# Patient Record
Sex: Male | Born: 1985 | Race: White | Hispanic: No | Marital: Single | Smoking: Current every day smoker
Health system: Southern US, Community
[De-identification: ages and names within clinical notes are randomized; demographics above are authoritative.]

## PROBLEM LIST (undated history)

## (undated) DIAGNOSIS — G4700 Insomnia, unspecified: Secondary | ICD-10-CM

## (undated) DIAGNOSIS — S0990XA Unspecified injury of head, initial encounter: Secondary | ICD-10-CM

## (undated) DIAGNOSIS — G43909 Migraine, unspecified, not intractable, without status migrainosus: Secondary | ICD-10-CM

## (undated) DIAGNOSIS — F1010 Alcohol abuse, uncomplicated: Secondary | ICD-10-CM

## (undated) HISTORY — PX: OTHER SURGICAL HISTORY: SHX169

## (undated) HISTORY — DX: Unspecified injury of head, initial encounter: S09.90XA

## (undated) HISTORY — DX: Migraine, unspecified, not intractable, without status migrainosus: G43.909

---

## 2015-01-12 ENCOUNTER — Emergency Department (HOSPITAL_COMMUNITY)
Admission: EM | Admit: 2015-01-12 | Discharge: 2015-01-12 | Disposition: A | Discharge status: Home / Self Care | Payer: Self-pay | Attending: Emergency Medicine | Admitting: Emergency Medicine

## 2015-01-12 ENCOUNTER — Encounter (HOSPITAL_COMMUNITY): Payer: Self-pay | Admitting: *Deleted

## 2015-01-12 DIAGNOSIS — Z720 Tobacco use: Secondary | ICD-10-CM | POA: Insufficient documentation

## 2015-01-12 DIAGNOSIS — Y939 Activity, unspecified: Secondary | ICD-10-CM | POA: Insufficient documentation

## 2015-01-12 DIAGNOSIS — T40601A Poisoning by unspecified narcotics, accidental (unintentional), initial encounter: Secondary | ICD-10-CM | POA: Insufficient documentation

## 2015-01-12 DIAGNOSIS — Y929 Unspecified place or not applicable: Secondary | ICD-10-CM | POA: Insufficient documentation

## 2015-01-12 DIAGNOSIS — Z79899 Other long term (current) drug therapy: Secondary | ICD-10-CM | POA: Insufficient documentation

## 2015-01-12 DIAGNOSIS — Y999 Unspecified external cause status: Secondary | ICD-10-CM | POA: Insufficient documentation

## 2015-01-12 MED ORDER — ONDANSETRON HCL 4 MG/2ML IJ SOLN
4.0000 mg | Freq: Once | INTRAMUSCULAR | Status: AC
  Administered 2015-01-12: 4 mg via INTRAVENOUS
  Filled 2015-01-12: qty 2

## 2015-01-12 NOTE — ED Notes (Signed)
MD at bedside. 

## 2015-01-12 NOTE — ED Notes (Signed)
Bed: [id number] Expected date:  Expected time:  Means of arrival:  Comments: EMS - OD w/ Narcan

## 2015-01-12 NOTE — ED Notes (Signed)
Pt arrives via ems with heroin overdose,  has also been using cocaine today. Unresponsive, cyanotic, and cold on EMS arrival to seen, pt was given 1mg  intranasal Narcan, without response,  IV established, additional 1mg  Narcan given, pt c/a/o on arrival to the ed. ST on the monitor (120's) enroute.

## 2015-01-12 NOTE — ED Notes (Signed)
Pt had vomited x1, notified dr. Effie Shy, pt c/o nausea, order for zofran. Pt family at bedside. Pt arousal to voice

## 2015-01-12 NOTE — Discharge Instructions (Signed)
Accidental Overdose  A drug overdose occurs when a chemical substance (drug or medication) is used in amounts large enough to overcome a person. This may result in severe illness or death. This is a type of poisoning. Accidental overdoses of medications or other substances come from a variety of reasons. When this happens accidentally, it is often because the person taking the substance does not know enough about what they have taken. Drugs which commonly cause overdose deaths are alcohol, psychotropic medications (medications which affect the mind), pain medications, illegal drugs (street drugs) such as cocaine and heroin, and multiple drugs taken at the same time. It may result from careless behavior (such as over-indulging at a party). Other causes of overdose may include multiple drug use, a lapse in memory, or drug use after a period of no drug use.   Sometimes overdosing occurs because a person cannot remember if they have taken their medication.   A common unintentional overdose in young children involves multi-vitamins containing iron. Iron is a part of the hemoglobin molecule in blood. It is used to transport oxygen to living cells. When taken in small amounts, iron allows the body to restock hemoglobin. In large amounts, it causes problems in the body. If this overdose is not treated, it can lead to death.  Never take medicines that show signs of tampering or do not seem quite right. Never take medicines in the dark or in poor lighting. Read the label and check each dose of medicine before you take it. When adults are poisoned, it happens most often through carelessness or lack of information. Taking medicines in the dark or taking medicine prescribed for someone else to treat the same type of problem is a dangerous practice.  SYMPTOMS   Symptoms of overdose depend on the medication and amount taken. They can vary from over-activity with stimulant over-dosage, to sleepiness from depressants such as  alcohol, narcotics and tranquilizers. Confusion, dizziness, nausea and vomiting may be present. If problems are severe enough coma and death may result.  DIAGNOSIS   Diagnosis and management are generally straightforward if the drug is known. Otherwise it is more difficult. At times, certain symptoms and signs exhibited by the patient, or blood tests, can reveal the drug in question.   TREATMENT   In an emergency department, most patients can be treated with supportive measures. Antidotes may be available if there has been an overdose of opioids or benzodiazepines. A rapid improvement will often occur if this is the cause of overdose.  At home or away from medical care:   There may be no immediate problems or warning signs in children.   Not everything works well in all cases of poisoning.   Take immediate action. Poisons may act quickly.   If you think someone has swallowed medicine or a household product, and the person is unconscious, having seizures (convulsions), or is not breathing, immediately call for an ambulance.  IF a person is conscious and appears to be doing OK but has swallowed a poison:   Do not wait to see what effect the poison will have. Immediately call a poison control center (listed in the white pages of your telephone book under "Poison Control" or inside the front cover with other emergency numbers). Some poison control centers have TTY capability for the deaf. Check with your local center if you or someone in your family requires this service.   Keep the container so you can read the label on the product for ingredients.     Describe what, when, and how much was taken and the age and condition of the person poisoned. Inform them if the person is vomiting, choking, drowsy, shows a change in color or temperature of skin, is conscious or unconscious, or is convulsing.   Do not cause vomiting unless instructed by medical personnel. Do not induce vomiting or force liquids into a person who  is convulsing, unconscious, or very drowsy.  Stay calm and in control.    Activated charcoal also is sometimes used in certain types of poisoning and you may wish to add a supply to your emergency medicines. It is available without a prescription. Call a poison control center before using this medication.  PREVENTION   Thousands of children die every year from unintentional poisoning. This may be from household chemicals, poisoning from carbon monoxide in a car, taking their parent's medications, or simply taking a few iron pills or vitamins with iron. Poisoning comes from unexpected sources.   Store medicines out of the sight and reach of children, preferably in a locked cabinet. Do not keep medications in a food cabinet. Always store your medicines in a secure place. Get rid of expired medications.   If you have children living with you or have them as occasional guests, you should have child-resistant caps on your medicine containers. Keep everything out of reach. Child proof your home.   If you are called to the telephone or to answer the door while you are taking a medicine, take the container with you or put the medicine out of the reach of small children.   Do not take your medication in front of children. Do not tell your child how good a medication is and how good it is for them. They may get the idea it is more of a treat.   If you are an adult and have accidentally taken an overdose, you need to consider how this happened and what can be done to prevent it from happening again. If this was from a street drug or alcohol, determine if there is a problem that needs addressing. If you are not sure a problems exists, it is easy to talk to a professional and ask them if they think you have a problem. It is better to handle this problem in this way before it happens again and has a much worse consequence.  Document Released: 09/17/2004 Document Revised: 09/26/2011 Document Reviewed: 02/23/2009  ExitCare  Patient Information 2015 [employer]. This information is not intended to replace advice given to you by your health care provider. Make sure you discuss any questions you have with your health care provider.

## 2015-01-12 NOTE — ED Provider Notes (Signed)
CSN: [id number]     Arrival date & time 01/12/15  1915 History   First MD Initiated Contact with Patient 01/12/15 1925     Chief Complaint  Patient presents with  . Drug Overdose     (Consider location/radiation/quality/duration/timing/severity/associated sxs/prior Treatment) HPI   Ricky Ford is a 29 y.o. male who presents for evaluation of narcotic overdose. Patient injected heroin, and subsequently had decreased responsiveness. A friend was with him and called EMS. They found the patient with breathing rate, about 4/m, and appearing cyanotic. He initially received Narcan intranasally, without improvement, then IV Narcan with rapid awakening. He did not require additional treatment. When evaluated in the ED, he was remorseful, and related that he had not used heroin previously. States he has been somewhat depressed recently because he does not have a girlfriend. He is working, and recently completed an outpatient treatment for substance abuse. He denies suicidal ideation. No recent illnesses. There are no other no modifying factors.   History reviewed. No pertinent past medical history. History reviewed. No pertinent past surgical history. No family history on file. History  Substance Use Topics  . Smoking status: Current Every Day Smoker  . Smokeless tobacco: Not on file  . Alcohol Use: Yes    Review of Systems  All other systems reviewed and are negative.     Allergies  Review of patient's allergies indicates no known allergies.  Home Medications   Prior to Admission medications   Medication Sig Start Date End Date Taking? Authorizing Provider  acetaminophen (TYLENOL) 500 MG tablet Take 1,000 mg by mouth every 6 (six) hours as needed (for pain.).   Yes Historical Provider, MD  citalopram (CELEXA) 20 MG tablet Take 20 mg by mouth daily.   Yes Historical Provider, MD  loratadine (CLARITIN) 10 MG tablet Take 10 mg by mouth daily.   Yes Historical Provider, MD  traZODone  (DESYREL) 50 MG tablet Take 50 mg by mouth at bedtime as needed for sleep.   Yes Historical Provider, MD   BP 122/71 mmHg  Pulse 107  Temp(Src) 98.3 F (36.8 C) (Oral)  Resp 19  Ht 5\' 1"  (1.549 m)  Wt 165 lb (74.844 kg)  BMI 31.19 kg/m2  SpO2 94% Physical Exam  Constitutional: He is oriented to person, place, and time. He appears well-developed and well-nourished.  HENT:  Head: Normocephalic and atraumatic.  Right Ear: External ear normal.  Left Ear: External ear normal.  Eyes: Conjunctivae and EOM are normal. Pupils are equal, round, and reactive to light.  Neck: Normal range of motion and phonation normal. Neck supple.  Cardiovascular: Normal rate, regular rhythm and normal heart sounds.   Pulmonary/Chest: Effort normal and breath sounds normal. He exhibits no bony tenderness.  Abdominal: Soft. There is no tenderness.  Musculoskeletal: Normal range of motion.  Neurological: He is alert and oriented to person, place, and time. No cranial nerve deficit or sensory deficit. He exhibits normal muscle tone. Coordination normal.  Skin: Skin is warm, dry and intact.  Psychiatric: He has a normal mood and affect. His behavior is normal. Judgment and thought content normal.  Nursing note and vitals reviewed.   ED Course  Procedures (including critical care time)  Medications  ondansetron (ZOFRAN) injection 4 mg (4 mg Intravenous Given 01/12/15 2146)    Patient Vitals for the past 24 hrs:  BP Temp Temp src Pulse Resp SpO2 Height Weight  01/12/15 2301 122/71 mmHg - - 107 19 94 % - -  01/12/15 2256 - - -  97 13 94 % - -  01/12/15 2246 - - - 94 13 94 % - -  01/12/15 2232 - - - 115 19 94 % - -  01/12/15 2230 123/70 mmHg - - 108 13 92 % - -  01/12/15 2200 115/70 mmHg - - 102 13 91 % - -  01/12/15 2147 - - - 98 10 91 % - -  01/12/15 2130 119/76 mmHg - - 105 13 (!) 89 % - -  01/12/15 2100 122/76 mmHg - - 102 14 96 % - -  01/12/15 2030 121/77 mmHg - - 111 14 98 % - -  01/12/15 2000  126/77 mmHg - - 117 16 96 % - -  01/12/15 1925 124/86 mmHg 98.3 F (36.8 C) Oral 115 15 90 %  (1.549 m) 165 lb (74.844 kg)    11:07 PM Reevaluation with update and discussion. After initial assessment and treatment, an updated evaluation reveals he is comfortable, alert, lucid. Somewhat low oxygen saturation at 92% that improves when his lungs are reexamined. The lungs continue to be clear. Findings discussed with patient and mother and father who are now here. All questions answered.Mancel Bale L    Labs Review Labs Reviewed - No data to display  Imaging Review No results found.   EKG Interpretation None      MDM   Final diagnoses:  Narcotic overdose, accidental or unintentional, initial encounter    Accidental overdose, with heroin. He is been observed for 4 hours, since the Narcan administration, and does not have decreased responsiveness. He is stable for discharge at this time.   Nursing Notes Reviewed/ Care Coordinated Applicable Imaging Reviewed Interpretation of Laboratory Data incorporated into ED treatment  The patient appears reasonably screened and/or stabilized for discharge and I doubt any other medical condition or other [facility] requiring further screening, evaluation, or treatment in the ED at this time prior to discharge.  Plan: Home Medications- none; Home Treatments- rest; return here if the recommended treatment, does not improve the symptoms; Recommended follow up- PCP of choice prn. I encouraged him to f/u at [facility] for further treatment. His father is going home with him tonight.    Mancel Bale, MD 01/12/15 [phone]

## 2015-12-11 ENCOUNTER — Emergency Department (HOSPITAL_COMMUNITY): Payer: BLUE CROSS/BLUE SHIELD

## 2015-12-11 ENCOUNTER — Emergency Department (HOSPITAL_COMMUNITY)
Admission: EM | Admit: 2015-12-11 | Discharge: 2015-12-11 | Disposition: A | Discharge status: Home Health | Payer: BLUE CROSS/BLUE SHIELD | Attending: Emergency Medicine | Admitting: Emergency Medicine

## 2015-12-11 ENCOUNTER — Encounter (HOSPITAL_COMMUNITY): Payer: Self-pay | Admitting: Emergency Medicine

## 2015-12-11 DIAGNOSIS — Z791 Long term (current) use of non-steroidal anti-inflammatories (NSAID): Secondary | ICD-10-CM | POA: Insufficient documentation

## 2015-12-11 DIAGNOSIS — F17200 Nicotine dependence, unspecified, uncomplicated: Secondary | ICD-10-CM | POA: Diagnosis not present

## 2015-12-11 DIAGNOSIS — R1012 Left upper quadrant pain: Secondary | ICD-10-CM | POA: Insufficient documentation

## 2015-12-11 DIAGNOSIS — Z79899 Other long term (current) drug therapy: Secondary | ICD-10-CM | POA: Diagnosis not present

## 2015-12-11 HISTORY — DX: Insomnia, unspecified: G47.00

## 2015-12-11 LAB — CBC
HCT: 41.5 % (ref 39.0–52.0)
Hemoglobin: 14.1 g/dL (ref 13.0–17.0)
MCH: 31.9 pg (ref 26.0–34.0)
MCHC: 34 g/dL (ref 30.0–36.0)
MCV: 93.9 fL (ref 78.0–100.0)
Platelets: 209 10*3/uL (ref 150–400)
RBC: 4.42 MIL/uL (ref 4.22–5.81)
RDW: 12.6 % (ref 11.5–15.5)
WBC: 7.9 10*3/uL (ref 4.0–10.5)

## 2015-12-11 LAB — COMPREHENSIVE METABOLIC PANEL
ALBUMIN: 4.6 g/dL (ref 3.5–5.0)
ALT: 30 U/L (ref 17–63)
AST: 28 U/L (ref 15–41)
Alkaline Phosphatase: 73 U/L (ref 38–126)
Anion gap: 11 (ref 5–15)
BUN: 6 mg/dL (ref 6–20)
CALCIUM: 8.8 mg/dL — AB (ref 8.9–10.3)
CHLORIDE: 100 mmol/L — AB (ref 101–111)
CO2: 21 mmol/L — ABNORMAL LOW (ref 22–32)
Creatinine, Ser: 0.75 mg/dL (ref 0.61–1.24)
GFR calc Af Amer: >60 mL/min (ref >60)
GFR calc non Af Amer: >60 mL/min (ref >60)
GLUCOSE: 135 mg/dL — AB (ref 65–99)
Potassium: 3.2 mmol/L — ABNORMAL LOW (ref 3.5–5.1)
SODIUM: 132 mmol/L — AB (ref 135–145)
Total Bilirubin: 1.1 mg/dL (ref 0.3–1.2)
Total Protein: 7.8 g/dL (ref 6.5–8.1)

## 2015-12-11 LAB — URINALYSIS, ROUTINE W REFLEX MICROSCOPIC
Bilirubin Urine: NEGATIVE
GLUCOSE, UA: NEGATIVE mg/dL
Hgb urine dipstick: NEGATIVE
Ketones, ur: 15 mg/dL — AB
LEUKOCYTES UA: NEGATIVE
NITRITE: NEGATIVE
PH: 6 (ref 5.0–8.0)
Protein, ur: NEGATIVE mg/dL
SPECIFIC GRAVITY, URINE: 1.004 — AB (ref 1.005–1.030)

## 2015-12-11 LAB — ETHANOL

## 2015-12-11 LAB — LIPASE, BLOOD: Lipase: 19 U/L (ref 11–51)

## 2015-12-11 LAB — I-STAT TROPONIN, ED: Troponin i, poc: 0 ng/mL (ref 0.00–0.08)

## 2015-12-11 MED ORDER — HYDROMORPHONE HCL 1 MG/ML IJ SOLN
1.0000 mg | Freq: Once | INTRAMUSCULAR | Status: AC
  Administered 2015-12-11: 1 mg via INTRAVENOUS
  Filled 2015-12-11: qty 1

## 2015-12-11 MED ORDER — SODIUM CHLORIDE 0.9 % IV SOLN
INTRAVENOUS | Status: DC
  Administered 2015-12-11: 19:00:00 via INTRAVENOUS

## 2015-12-11 MED ORDER — ONDANSETRON HCL 4 MG/2ML IJ SOLN
4.0000 mg | Freq: Once | INTRAMUSCULAR | Status: AC
  Administered 2015-12-11: 4 mg via INTRAVENOUS
  Filled 2015-12-11: qty 2

## 2015-12-11 MED ORDER — NAPROXEN 500 MG PO TABS: 500.0000 mg | ORAL_TABLET | Freq: Two times a day (BID) | ORAL | Status: DC

## 2015-12-11 MED ORDER — SODIUM CHLORIDE 0.9 % IV BOLUS (SEPSIS)
1000.0000 mL | Freq: Once | INTRAVENOUS | Status: AC
  Administered 2015-12-11: 1000 mL via INTRAVENOUS

## 2015-12-11 NOTE — ED Notes (Signed)
Pt given discharge instructions. Denies further questions or concerns. Pt states pain and nausea at a controlled level. Pt ambulatory to exit without difficulty.

## 2015-12-11 NOTE — ED Notes (Signed)
Pt requested IV to be removed and to have ice water. Spoke with Dr. Lynelle DoctorKnapp, pt to be discharged. IV removed intact. Site clean and dry. Pt given ice water per request. Pt allowed to change clothes at this time. Awaiting discharge papers.

## 2015-12-11 NOTE — ED Notes (Signed)
Pt aware urine sample is needed, cup provieded

## 2015-12-11 NOTE — Discharge Instructions (Signed)
Flank Pain °Flank pain refers to pain that is located on the side of the body between the upper abdomen and the back. The pain may occur over a short period of time (acute) or may be long-term or reoccurring (chronic). It may be mild or severe. Flank pain can be caused by many things. °CAUSES  °Some of the more common causes of flank pain include: °· Muscle strains.   °· Muscle spasms.   °· A disease of your spine (vertebral disk disease).   °· A lung infection (pneumonia).   °· Fluid around your lungs (pulmonary edema).   °· A kidney infection.   °· Kidney stones.   °· A very painful skin rash caused by the chickenpox virus (shingles).   °· Gallbladder disease.   °HOME CARE INSTRUCTIONS  °Home care will depend on the cause of your pain. In general, °· Rest as directed by your caregiver. °· Drink enough fluids to keep your urine clear or pale yellow. °· Only take over-the-counter or prescription medicines as directed by your caregiver. Some medicines may help relieve the pain. °· Tell your caregiver about any changes in your pain. °· Follow up with your caregiver as directed. °SEEK IMMEDIATE MEDICAL CARE IF:  °· Your pain is not controlled with medicine.   °· You have new or worsening symptoms. °· Your pain increases.   °· You have abdominal pain.   °· You have shortness of breath.   °· You have persistent nausea or vomiting.   °· You have swelling in your abdomen.   °· You feel faint or pass out.   °· You have blood in your urine. °· You have a fever or persistent symptoms for more than 2-3 days. °· You have a fever and your symptoms suddenly get worse. °MAKE SURE YOU:  °· Understand these instructions. °· Will watch your condition. °· Will get help right away if you are not doing well or get worse. °  °This information is not intended to replace advice given to you by your health care provider. Make sure you discuss any questions you have with your health care provider. °  °Document Released: 08/25/2005 Document  Revised: 03/28/2012 Document Reviewed: 02/16/2012 °Elsevier Interactive Patient Education ©2016 [employer] ° °

## 2015-12-11 NOTE — ED Notes (Signed)
Pt c/o dizziness, abdominal pain LUQ abdominal mass x several weeks. Pt states he has been drinking heavily, 6-8 beers last night.

## 2015-12-11 NOTE — ED Notes (Signed)
Pt reports using cocaine at 16:30 on 12/10/15.

## 2015-12-11 NOTE — ED Notes (Signed)
Knapp MD at bedside  

## 2015-12-11 NOTE — ED Notes (Signed)
Pt observed ambulating to room from triage without difficulty.

## 2015-12-11 NOTE — ED Provider Notes (Signed)
CSN: [id number]     Arrival date & time 12/11/15  1613 History   First MD Initiated Contact with Patient 12/11/15 1710     Chief Complaint  Patient presents with  . Abdominal Pain  . Near Syncope    HPI Pt has been having pain in his left flank and upper abdomen.  It started a few weeks ago.  It got more severe last night.  He has been using alcohol regularly and he was using cocaine yesterday.  No vomiting.   NO diarrhea.  No dysuria.  Palpation and movement makes it worse. Past Medical History  Diagnosis Date  . Insomnia    No past surgical history on file. No family history on file. Social History  Substance Use Topics  . Smoking status: Current Every Day Smoker  . Smokeless tobacco: Not on file  . Alcohol Use: Yes    Review of Systems  All other systems reviewed and are negative.     Allergies  Review of patient's allergies indicates no known allergies.  Home Medications   Prior to Admission medications   Medication Sig Start Date End Date Taking? Authorizing Provider  acetaminophen (TYLENOL) 500 MG tablet Take 1,000 mg by mouth every 6 (six) hours as needed (for pain.).   Yes Historical Provider, MD  citalopram (CELEXA) 20 MG tablet Take 20 mg by mouth daily.   Yes Historical Provider, MD  fluticasone (FLONASE) 50 MCG/ACT nasal spray Place 1 spray into both nostrils daily as needed for allergies or rhinitis.   Yes Historical Provider, MD  loratadine (CLARITIN) 10 MG tablet Take 10 mg by mouth daily as needed for allergies.    Yes Historical Provider, MD  naproxen (NAPROSYN) 500 MG tablet Take 1 tablet (500 mg total) by mouth 2 (two) times daily. 12/11/15   Linwood Dibbles, MD   BP 134/93 mmHg  Pulse 79  Temp(Src) 98.7 F (37.1 C) (Oral)  Resp 19  SpO2 97% Physical Exam  Constitutional: He appears well-developed and well-nourished. No distress.  HENT:  Head: Normocephalic and atraumatic.  Right Ear: External ear normal.  Left Ear: External ear normal.  Eyes:  Conjunctivae are normal. Right eye exhibits no discharge. Left eye exhibits no discharge. No scleral icterus.  Neck: Neck supple. No tracheal deviation present.  Cardiovascular: Regular rhythm and intact distal pulses.  Tachycardia present.   Pulmonary/Chest: Effort normal and breath sounds normal. No stridor. No respiratory distress. He has no wheezes. He has no rales.  Abdominal: Soft. Bowel sounds are normal. He exhibits no distension. There is no tenderness. There is no rebound and no guarding.  Musculoskeletal: He exhibits no edema or tenderness.  Neurological: He is alert. He has normal strength. No cranial nerve deficit (no facial droop, extraocular movements intact, no slurred speech) or sensory deficit. He exhibits normal muscle tone. He displays no seizure activity. Coordination normal.  Skin: Skin is warm and dry. No rash noted.  Psychiatric: He has a normal mood and affect.  Nursing note and vitals reviewed.   ED Course  Procedures (including critical care time) Labs Review Labs Reviewed  COMPREHENSIVE METABOLIC PANEL - Abnormal; Notable for the following:    Sodium 132 (*)    Potassium 3.2 (*)    Chloride 100 (*)    CO2 21 (*)    Glucose, Bld 135 (*)    Calcium 8.8 (*)    All other components within normal limits  URINALYSIS, ROUTINE W REFLEX MICROSCOPIC (NOT AT [facility]) - Abnormal; Notable  for the following:    Specific Gravity, Urine 1.004 (*)    Ketones, ur 15 (*)    All other components within normal limits  LIPASE, BLOOD  CBC  ETHANOL  I-STAT TROPOININ, ED    Imaging Review Ct Abdomen Pelvis Wo Contrast  12/11/2015  CLINICAL DATA:  Dizziness, abdominal pain, left upper quadrant pain EXAM: CT ABDOMEN AND PELVIS WITHOUT CONTRAST TECHNIQUE: Multidetector CT imaging of the abdomen and pelvis was performed following the standard protocol without IV contrast. COMPARISON:  None. FINDINGS: Lower chest:  Clear lung bases.  Normal heart size. Hepatobiliary: Normal liver.   Normal gallbladder. Pancreas: Normal pancreas. Spleen: Normal spleen. Adrenals/Urinary Tract: Normal adrenal glands. Normal kidneys. No urolithiasis or obstructive uropathy. Normal bladder. Stomach/Bowel: No bowel wall thickening or dilatation. No pneumatosis, pneumoperitoneum or portal venous gas. No abdominal or pelvic free fluid. Vascular/Lymphatic: Normal caliber abdominal aorta. No lymphadenopathy. Other: No fluid collection or hematoma. Musculoskeletal: No lytic or sclerotic osseous lesion. No acute osseous abnormality. IMPRESSION: 1. No acute abdominal or pelvic pathology. Electronically Signed   By: Elige KoHetal  Patel   On: 12/11/2015 18:09   I have personally reviewed and evaluated these images and lab results as part of my medical decision-making.   MDM   Final diagnoses:  Left upper quadrant pain    Lab tests are normal.  CT scan without acute findings.  ? Muscular etiology.  DC home with nsaids.   Follow up with PCP    Linwood DibblesJon Zen Felling, MD 12/11/15 2030

## 2015-12-11 NOTE — ED Notes (Signed)
Patient still unable to provide urine sample. Patient will notify staff when able to provide sample.

## 2015-12-11 NOTE — ED Notes (Signed)
Received report on pt. Bedside introduction compelted with pt and pt's father at bedside. Pt states pain decreased to 5/10 and is tolerable at this time. Pt denies nausea. Pt remains with elevated BP. NS infusion continues. Pt able to void. Urine sample sent. Monitoring continues.

## 2017-09-01 IMAGING — CT CT ABD-PELV W/O CM
2 of 4 series · 16 of 46 positions shown, 18 images · non-contrast
Comparison: None.

CLINICAL DATA: Dizziness, abdominal pain, left upper quadrant pain

EXAM:
CT ABDOMEN AND PELVIS WITHOUT CONTRAST
TECHNIQUE: Multidetector CT imaging of the abdomen and pelvis was performed
following the standard protocol without IV contrast.

[Series 2: abd/pel w/o · axial · non-contrast · 0.72mm/px · z∈[-306,+99]mm · 13 of 91 slices shown, 15 images]
[im 5/91  soft-tissue]
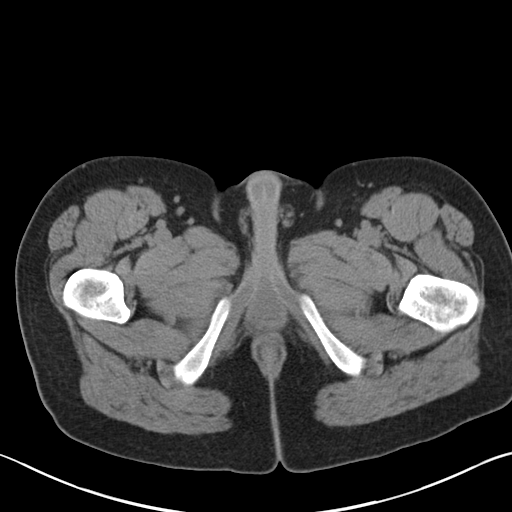
[im 5/91  bone]
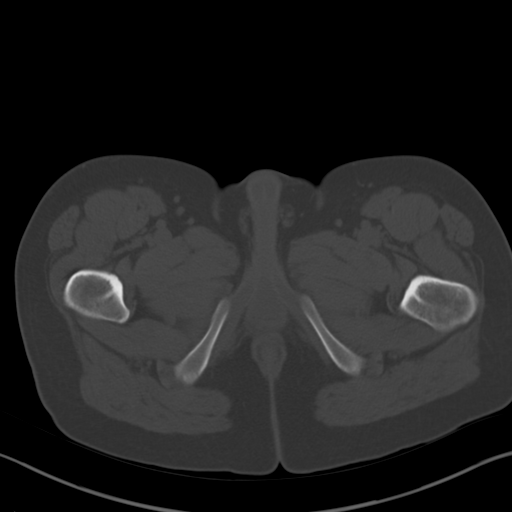
[im 15/91  soft-tissue]
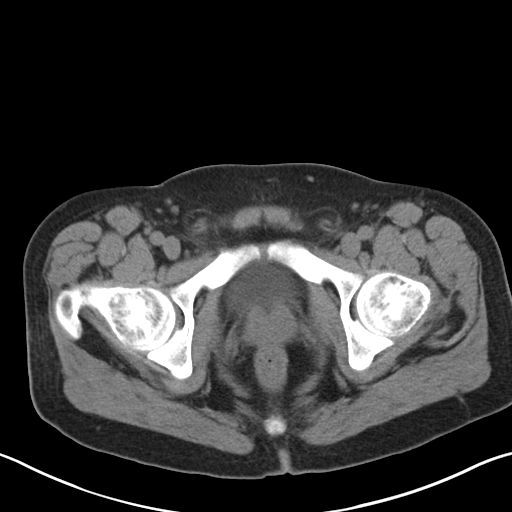
[im 19/91  soft-tissue]
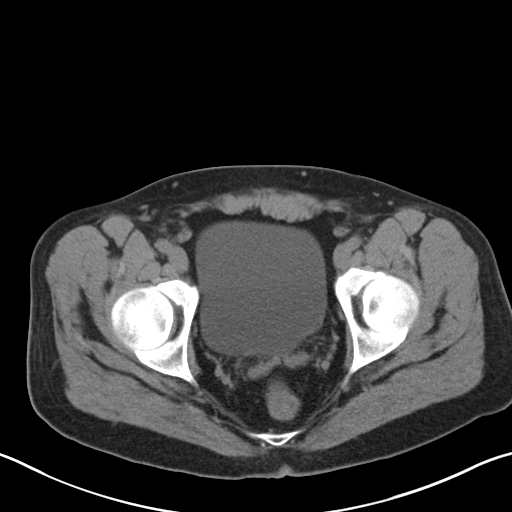
[im 24/91  soft-tissue]
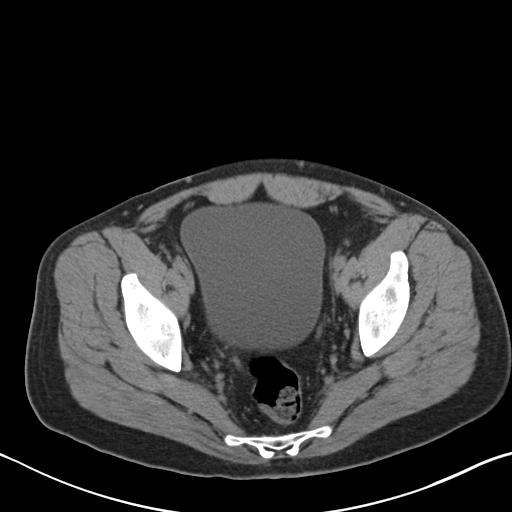
[im 34/91  soft-tissue]
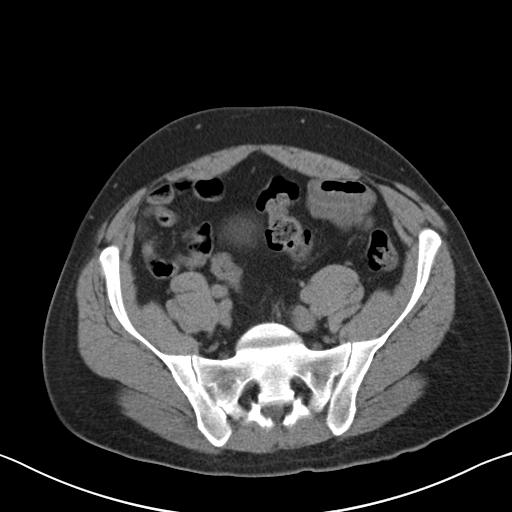
[im 38/91  soft-tissue]
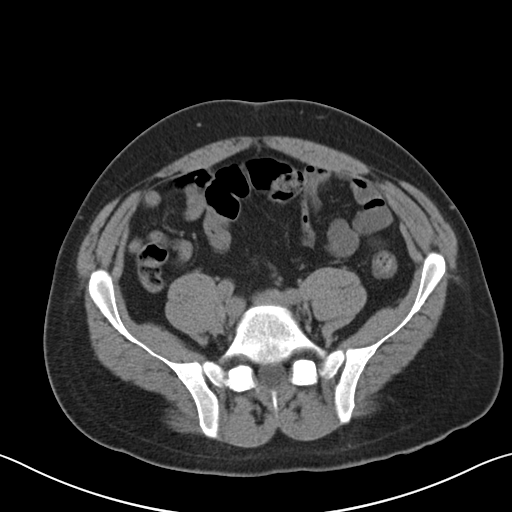
[im 48/91  soft-tissue]
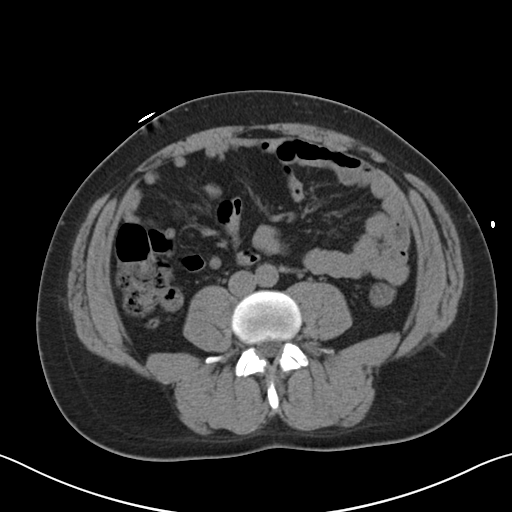
[im 53/91  soft-tissue]
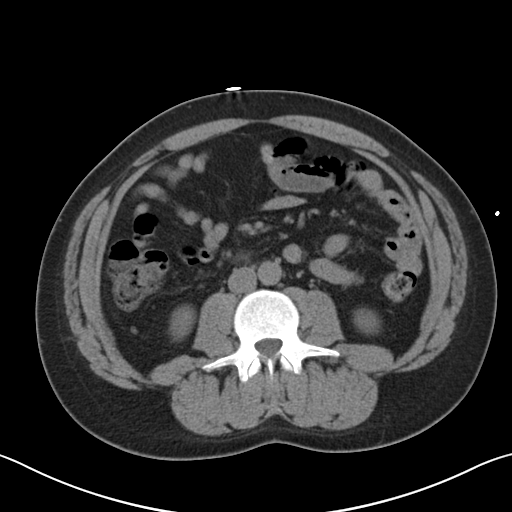
[im 57/91  soft-tissue]
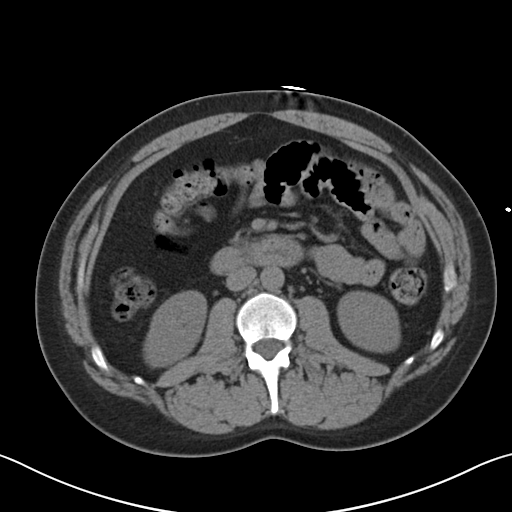
[im 57/91  bone]
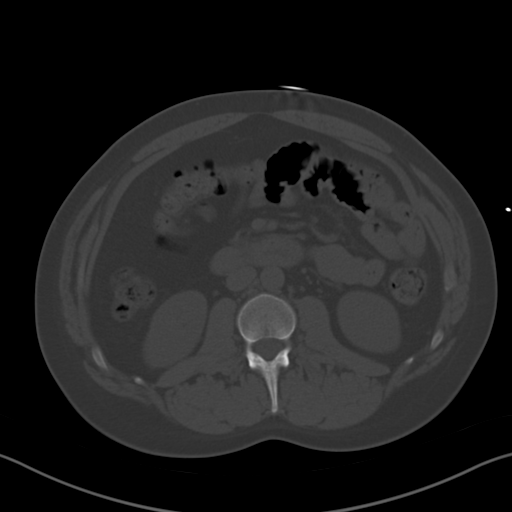
[im 67/91  soft-tissue]
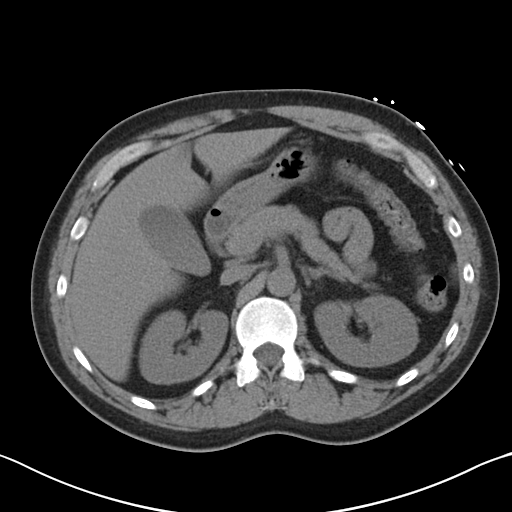
[im 72/91  soft-tissue]
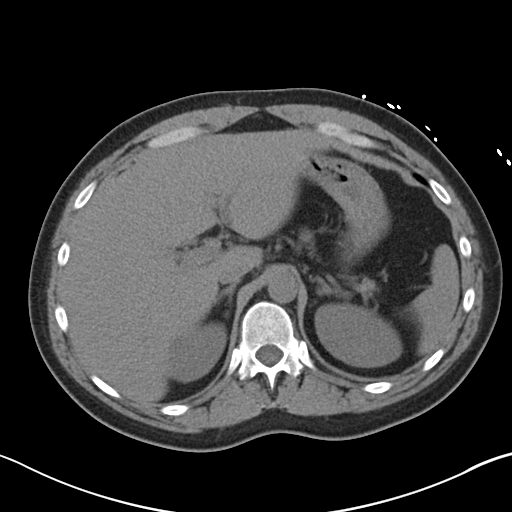
[im 76/91  soft-tissue]
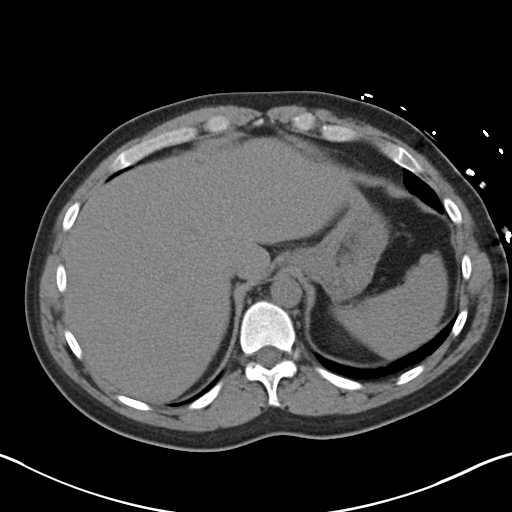
[im 86/91  soft-tissue]
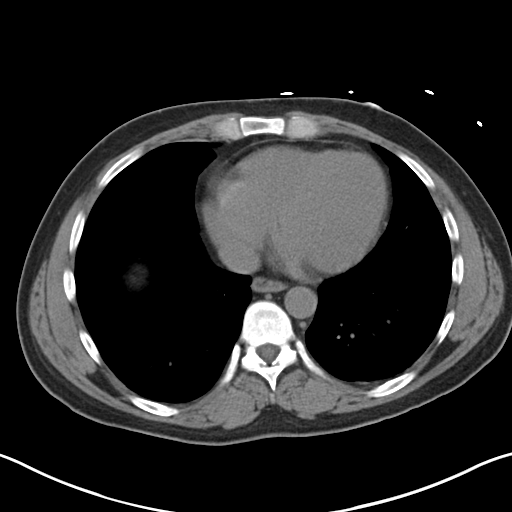

[Series 3: coronal · coronal · 0.68mm/px · 3 of 139 slices shown]
[im 47/139  soft-tissue]
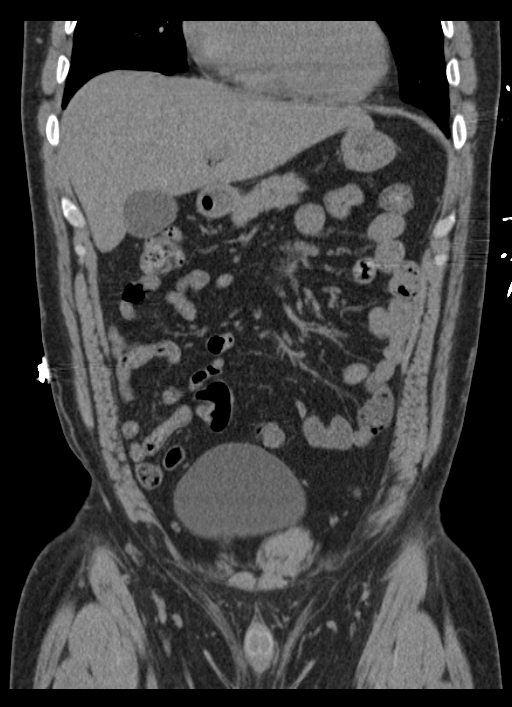
[im 62/139  soft-tissue]
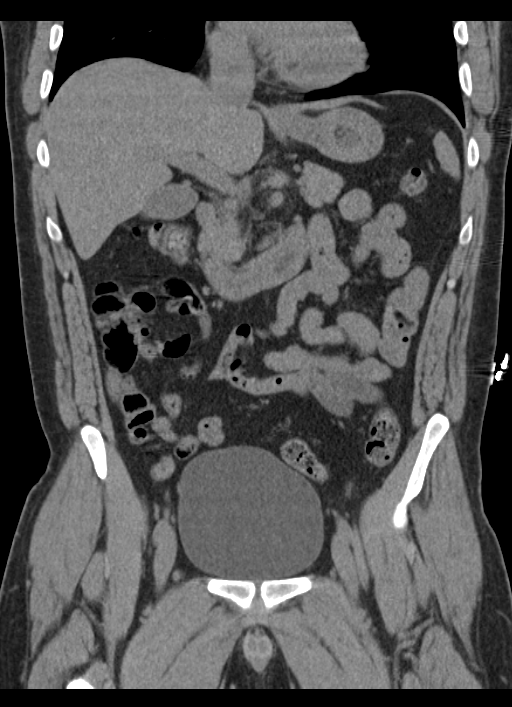
[im 77/139  soft-tissue]
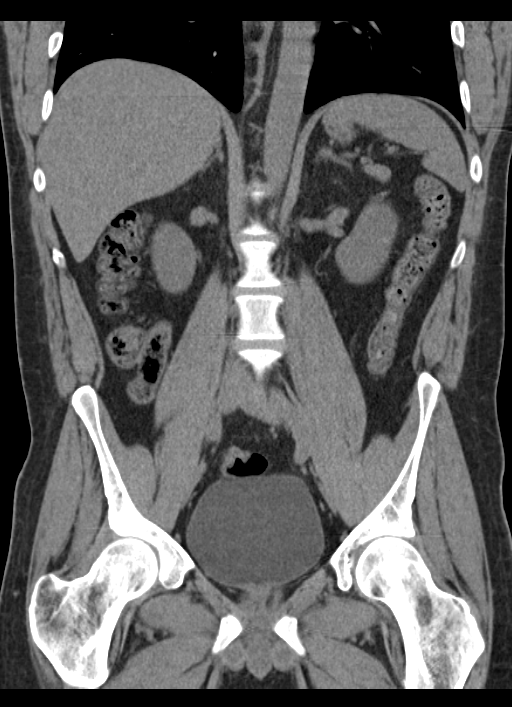

[16 of 46 positions shown; findings below may reference images not displayed]

FINDINGS: Lower chest:  Clear lung bases.  Normal heart size.

Hepatobiliary: Normal liver.  Normal gallbladder.

Pancreas: Normal pancreas.

Spleen: Normal spleen.

Adrenals/Urinary Tract: Normal adrenal glands. Normal kidneys. No
urolithiasis or obstructive uropathy. Normal bladder.

Stomach/Bowel: No bowel wall thickening or dilatation. No
pneumatosis, pneumoperitoneum or portal venous gas. No abdominal or
pelvic free fluid.

Vascular/Lymphatic: Normal caliber abdominal aorta. No
lymphadenopathy.

Other: No fluid collection or hematoma.

Musculoskeletal: No lytic or sclerotic osseous lesion. No acute
osseous abnormality.
IMPRESSION: 1. No acute abdominal or pelvic pathology.

## 2019-01-16 ENCOUNTER — Other Ambulatory Visit: Payer: Self-pay

## 2019-01-16 ENCOUNTER — Encounter (HOSPITAL_COMMUNITY): Payer: Self-pay

## 2019-01-16 ENCOUNTER — Emergency Department (HOSPITAL_COMMUNITY)
Admission: EM | Admit: 2019-01-16 | Discharge: 2019-01-16 | Disposition: A | Discharge status: Home / Self Care | Payer: BC Managed Care – PPO | Attending: Emergency Medicine | Admitting: Emergency Medicine

## 2019-01-16 DIAGNOSIS — F22 Delusional disorders: Secondary | ICD-10-CM | POA: Diagnosis not present

## 2019-01-16 DIAGNOSIS — F1910 Other psychoactive substance abuse, uncomplicated: Secondary | ICD-10-CM | POA: Diagnosis not present

## 2019-01-16 DIAGNOSIS — Z79899 Other long term (current) drug therapy: Secondary | ICD-10-CM | POA: Insufficient documentation

## 2019-01-16 DIAGNOSIS — F419 Anxiety disorder, unspecified: Secondary | ICD-10-CM | POA: Insufficient documentation

## 2019-01-16 DIAGNOSIS — F17210 Nicotine dependence, cigarettes, uncomplicated: Secondary | ICD-10-CM | POA: Diagnosis not present

## 2019-01-16 DIAGNOSIS — R443 Hallucinations, unspecified: Secondary | ICD-10-CM

## 2019-01-16 MED ORDER — HYDROXYZINE HCL 25 MG PO TABS
50.0000 mg | ORAL_TABLET | Freq: Once | ORAL | Status: AC
  Administered 2019-01-16: 10:00:00 50 mg via ORAL
  Filled 2019-01-16: qty 2

## 2019-01-16 NOTE — ED Notes (Signed)
Bed: QP61 Expected date: 01/16/19 Expected time: 6:50 AM Means of arrival:  Comments:

## 2019-01-16 NOTE — ED Provider Notes (Signed)
[facility] DEPT Provider Note   CSN: [id number] Arrival date & time: 01/16/19  0919     History   Chief Complaint Chief Complaint  Patient presents with  . Drug Problem    HPI Kerron Sedano is a 33 y.o. male.     HPI 33 year old male who admits to alcohol and methamphetamine use over the past several days.  He states he feels abnormal and is having hallucinations.  He is alert and oriented x4.  He knows it is 2020.  He knows current events.  He knows what month it is.  He has no homicidal or suicidal thoughts.  He currently holds a job.  He feels like there is a period of time over the past 24 hours that he does not remember.  He has no other complaints at this time.   Past Medical History:  Diagnosis Date  . Insomnia     There are no active problems to display for this patient.   History reviewed. No pertinent surgical history.      Home Medications    Prior to Admission medications   Medication Sig Start Date End Date Taking? Authorizing Provider  acetaminophen (TYLENOL) 500 MG tablet Take 1,000 mg by mouth every 6 (six) hours as needed (for pain.).    [provider]  citalopram (CELEXA) 20 MG tablet Take 20 mg by mouth daily.    [provider]  fluticasone (FLONASE) 50 MCG/ACT nasal spray Place 1 spray into both nostrils daily as needed for allergies or rhinitis.    [provider]  loratadine (CLARITIN) 10 MG tablet Take 10 mg by mouth daily as needed for allergies.     [provider]  naproxen (NAPROSYN) 500 MG tablet Take 1 tablet (500 mg total) by mouth 2 (two) times daily. 12/11/15   Dorie Rank, MD    Family History No family history on file.  Social History Social History   Tobacco Use  . Smoking status: Current Every Day Smoker    Packs/day: 1.00    Types: Cigarettes  . Smokeless tobacco: Never Used  Substance Use Topics  . Alcohol use: Yes  . Drug use: Yes    Types: Cocaine,  Methamphetamines     Allergies   Patient has no known allergies.   Review of Systems Review of Systems  All other systems reviewed and are negative.    Physical Exam Updated Vital Signs BP (!) 132/105   Pulse (!) 139   Temp 99 F (37.2 C) (Oral)   Resp 16   Ht 5\' 11"  (1.803 m)   Wt 83.9 kg   SpO2 96%   BMI 25.80 kg/m   Physical Exam Vitals signs and nursing note reviewed.  Constitutional:      Appearance: He is well-developed.  HENT:     Head: Normocephalic and atraumatic.  Neck:     Musculoskeletal: Normal range of motion.  Cardiovascular:     Rate and Rhythm: Regular rhythm. Tachycardia present.     Heart sounds: Normal heart sounds.  Pulmonary:     Effort: Pulmonary effort is normal. No respiratory distress.     Breath sounds: Normal breath sounds.  Abdominal:     General: There is no distension.     Palpations: Abdomen is soft.     Tenderness: There is no abdominal tenderness.  Musculoskeletal: Normal range of motion.  Skin:    General: Skin is warm and dry.  Neurological:     Mental  Status: He is alert and oriented to person, place, and time.  Psychiatric:        Mood and Affect: Mood is anxious.        Behavior: Behavior is not agitated, aggressive or hyperactive. Behavior is cooperative.        Thought Content: Thought content is delusional. Thought content does not include homicidal or suicidal ideation.        Cognition and Memory: Memory is impaired.      ED Treatments / Results  Labs (all labs ordered are listed, but only abnormal results are displayed) Labs Reviewed - No data to display  EKG None  Radiology No results found.  Procedures Procedures (including critical care time)  Medications Ordered in ED Medications - No data to display   Initial Impression / Assessment and Plan / ED Course  I have reviewed the triage vital signs and the nursing notes.  Pertinent labs & imaging results that were available during my care of  the patient were reviewed by me and considered in my medical decision making (see chart for details).        Hallucinations in the setting of significant substance abuse.  This is likely a substance abuse related issue.  He is calm and cooperative.  He is intelligent.  He is alert and oriented x4.  He does not appear to be a threat to himself or others at this time.  I do not think he needs acute psychiatric hospitalization.  [facility]  He has information on [facility].  I suspect as he is substance abuse decreases his hallucinations will improve.  He has a nonfocal neurologic exam at this time with no weakness of his arms or legs.  Discharged home in good condition.  Patient encouraged to return to the ER for new or worsening symptoms  Final Clinical Impressions(s) / ED Diagnoses   Final diagnoses:  Substance abuse [facility](HCC)  Hallucinations    ED Discharge Orders    None       Azalia Bilisampos, Charlett Merkle, MD 01/16/19 1125

## 2019-01-16 NOTE — ED Notes (Signed)
Discharge Instructions read to pt. Escorted pt to the waiting room where he said that his family is waiting.
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
## 2020-09-29 ENCOUNTER — Other Ambulatory Visit: Payer: Self-pay

## 2020-09-29 ENCOUNTER — Ambulatory Visit (INDEPENDENT_AMBULATORY_CARE_PROVIDER_SITE_OTHER): Payer: BC Managed Care – PPO | Admitting: Licensed Clinical Social Worker

## 2020-09-29 DIAGNOSIS — F1994 Other psychoactive substance use, unspecified with psychoactive substance-induced mood disorder: Secondary | ICD-10-CM

## 2020-09-29 DIAGNOSIS — F1020 Alcohol dependence, uncomplicated: Secondary | ICD-10-CM | POA: Diagnosis not present

## 2020-09-29 NOTE — Progress Notes (Incomplete)
6 yr ago [facility] get parents off back to manage January cbd gummies Last SI: wk before rehab; passive 'tired of eeling like I did drunk; easier to not be here' called friends/family feb 20; rehab feb 21st  June 2020; gf got serious; got promotion at work dirnking 1.5 pt before met gf; stopped cold [place]; november-jan 60-70 hr wk; wake up with hang over; g/f left a few times to stay with ex husband (have kid together) Before living byself; isolating; January 2 wks sober 2022 w/d and anxiety cussed someoneout out at work; demoted; agreed to move to different department Going back for a while but then find a new job Decided to move; work together  Hx great childhood with family; made fun of as a child Didn't finishe college; 'oh do drugs' with friends Weed few years Moved back to [place] 2014 Sleep an appetitite: improved since rehab (melatonin and remeeron, buspar)  wory problems, racing thoughts; memory problems/ loss of interest; excessive worry, panic attacks, poor consentration; Closed adoption  Asking parents for help for 10 years Adoptive parents growin up Mom taught at same school to 8th grade; dad worked a lot 'did want he had to with the family' had trouble when I was a team [employer] 01-2013/15 Relationship with parents (I dont drink around then) will call when drunk and upsets parents; before met gf spent weekend per month Strained around 10 years ago Know they're there for me Parents living [place]  Moved home has been good; starting to get closer with sister Born in [place] for 6-7 years 'blocked out my childhood' kid made fun of; sociallly that great; moved to  b/c dad got a new job; grew up in [employer] school in [place]; Health visitor;  G/f son 28 Got home saturday  1 semester at [employer] then dropped out; 32 hrs away from [facility]; psychology When quit drugs changed from criminal justice to psychology   Age [age] wild out; sporadic past 10 years 'never alone, never  bought social environment and most of the time I got drunk;  2015 lives with parents 7 months Moved with roommates smoked intermittent 2015 drinking-2020 3-4x wekly up to 6 days weekly 1-1.5 pt each time G/f 2021 didn't know extent of drinking; sober for (moved in 3 months after dating) gradually starting drinking several months told gf 'we agreed I neded help'

## 2020-09-30 ENCOUNTER — Encounter (HOSPITAL_COMMUNITY): Payer: Self-pay | Admitting: Medical

## 2020-09-30 ENCOUNTER — Other Ambulatory Visit (HOSPITAL_COMMUNITY): Payer: BC Managed Care – PPO | Attending: Psychiatry | Admitting: Medical

## 2020-09-30 DIAGNOSIS — F1994 Other psychoactive substance use, unspecified with psychoactive substance-induced mood disorder: Secondary | ICD-10-CM | POA: Diagnosis not present

## 2020-09-30 DIAGNOSIS — F341 Dysthymic disorder: Secondary | ICD-10-CM

## 2020-09-30 DIAGNOSIS — Z62810 Personal history of physical and sexual abuse in childhood: Secondary | ICD-10-CM | POA: Insufficient documentation

## 2020-09-30 DIAGNOSIS — F17210 Nicotine dependence, cigarettes, uncomplicated: Secondary | ICD-10-CM

## 2020-09-30 DIAGNOSIS — Z0282 Encounter for adoption services: Secondary | ICD-10-CM

## 2020-09-30 DIAGNOSIS — Z811 Family history of alcohol abuse and dependence: Secondary | ICD-10-CM | POA: Diagnosis not present

## 2020-09-30 DIAGNOSIS — Z7141 Alcohol abuse counseling and surveillance of alcoholic: Secondary | ICD-10-CM | POA: Insufficient documentation

## 2020-09-30 DIAGNOSIS — F1910 Other psychoactive substance abuse, uncomplicated: Secondary | ICD-10-CM

## 2020-09-30 DIAGNOSIS — Z62811 Personal history of psychological abuse in childhood: Secondary | ICD-10-CM | POA: Insufficient documentation

## 2020-09-30 DIAGNOSIS — F10280 Alcohol dependence with alcohol-induced anxiety disorder: Secondary | ICD-10-CM | POA: Insufficient documentation

## 2020-09-30 DIAGNOSIS — T7412XS Child physical abuse, confirmed, sequela: Secondary | ICD-10-CM

## 2020-09-30 DIAGNOSIS — Z6372 Alcoholism and drug addiction in family: Secondary | ICD-10-CM | POA: Insufficient documentation

## 2020-09-30 DIAGNOSIS — F4312 Post-traumatic stress disorder, chronic: Secondary | ICD-10-CM | POA: Insufficient documentation

## 2020-09-30 DIAGNOSIS — T7432XS Child psychological abuse, confirmed, sequela: Secondary | ICD-10-CM

## 2020-09-30 DIAGNOSIS — F1020 Alcohol dependence, uncomplicated: Secondary | ICD-10-CM

## 2020-09-30 DIAGNOSIS — F10980 Alcohol use, unspecified with alcohol-induced anxiety disorder: Secondary | ICD-10-CM | POA: Diagnosis not present

## 2020-09-30 DIAGNOSIS — F17200 Nicotine dependence, unspecified, uncomplicated: Secondary | ICD-10-CM | POA: Insufficient documentation

## 2020-09-30 MED ORDER — BUSPIRONE HCL 10 MG PO TABS: 10.0000 mg | ORAL_TABLET | Freq: Three times a day (TID) | ORAL | 2 refills | Status: DC

## 2020-09-30 MED ORDER — MIRTAZAPINE 30 MG PO TABS: 30.0000 mg | ORAL_TABLET | Freq: Every day | ORAL | 0 refills | Status: DC

## 2020-09-30 MED ORDER — BACLOFEN 10 MG PO TABS: 10.0000 mg | ORAL_TABLET | Freq: Three times a day (TID) | ORAL | 0 refills | Status: DC
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
## 2020-10-01 ENCOUNTER — Other Ambulatory Visit (HOSPITAL_COMMUNITY): Payer: BC Managed Care – PPO | Admitting: Licensed Clinical Social Worker

## 2020-10-01 ENCOUNTER — Other Ambulatory Visit: Payer: Self-pay

## 2020-10-01 DIAGNOSIS — Z62811 Personal history of psychological abuse in childhood: Secondary | ICD-10-CM | POA: Diagnosis not present

## 2020-10-01 DIAGNOSIS — F4312 Post-traumatic stress disorder, chronic: Secondary | ICD-10-CM

## 2020-10-01 DIAGNOSIS — F1020 Alcohol dependence, uncomplicated: Secondary | ICD-10-CM

## 2020-10-01 DIAGNOSIS — F10280 Alcohol dependence with alcohol-induced anxiety disorder: Secondary | ICD-10-CM | POA: Diagnosis present

## 2020-10-01 DIAGNOSIS — Z6372 Alcoholism and drug addiction in family: Secondary | ICD-10-CM | POA: Diagnosis not present

## 2020-10-01 DIAGNOSIS — F1994 Other psychoactive substance use, unspecified with psychoactive substance-induced mood disorder: Secondary | ICD-10-CM

## 2020-10-01 DIAGNOSIS — F17200 Nicotine dependence, unspecified, uncomplicated: Secondary | ICD-10-CM | POA: Diagnosis not present

## 2020-10-01 DIAGNOSIS — Z7141 Alcohol abuse counseling and surveillance of alcoholic: Secondary | ICD-10-CM | POA: Diagnosis not present

## 2020-10-01 DIAGNOSIS — F341 Dysthymic disorder: Secondary | ICD-10-CM | POA: Diagnosis not present

## 2020-10-01 DIAGNOSIS — Z62810 Personal history of physical and sexual abuse in childhood: Secondary | ICD-10-CM | POA: Diagnosis not present

## 2020-10-02 NOTE — Progress Notes (Signed)
Daily Group Progress Note  Program: CD-IOP   Group Time: 1pm-2:30pm  Participation Level: Active Behavioral Response: Appropriate Type of Therapy: Process Group Topic: Clinician met with clients, assessing for SI/HI/psychosis and overall level of functioning including attendance of recovery meetings and relapse or challenges to sobriety. Clinician and group members discussed highs and lows from previous day and skills used to address. Clinician and group members read Daily Reflection and clinician facilitated discussion on topic related to recovery. Clinician and group members processed the role of sponsors in the [place] program. Clinician and group members completed [place] as an example of guided journaling and each member shared with the group.    Group Time: 2:30pm-4pm Participation Level: Active  Behavioral Response: Appropriate Type of Therapy: Psycho-education Group Topic: Clinician provided psycho-educational group on PAWS. Clinician provided supplemental video with discussion on common PAWS symptoms and skills to help with management. Clinician utilized Post Acute Withdrawal (PAW) Self-Evaluation developed by Patrina Levering. Clinician and group members discussed the importance of being aware and tracking symptoms during recovery. Clinician provided supplemental video of information related to PAWS, common triggers, and relaxation techniques for management of symptoms. Clinician provided exercises for mindfulness as an alternative to meditations.    Summary: Client showed progress toward goal of achieving and maintaining sobriety AEB checking in with sobriety date remaining 09/07/20. Client needs continued progress on building sober support system outside of the home. Client was receptive to psycho-educational information and able to identify behaviors to track which would be triggers for relapse. Client added information about PAWs learned recently in residential  treatment with group members.   Family Program: Family present? No   Name of family member(s): NA  UDS collected: No Results: pending  AA/NA attended?: No; plans to try virtual meeting over the weekend  Sponsor?: No   Olegario Messier, LCSW

## 2020-10-05 ENCOUNTER — Other Ambulatory Visit: Payer: Self-pay

## 2020-10-05 ENCOUNTER — Other Ambulatory Visit (HOSPITAL_COMMUNITY): Payer: BC Managed Care – PPO | Admitting: Licensed Clinical Social Worker

## 2020-10-05 DIAGNOSIS — F4312 Post-traumatic stress disorder, chronic: Secondary | ICD-10-CM

## 2020-10-05 DIAGNOSIS — F1994 Other psychoactive substance use, unspecified with psychoactive substance-induced mood disorder: Secondary | ICD-10-CM

## 2020-10-05 DIAGNOSIS — F1020 Alcohol dependence, uncomplicated: Secondary | ICD-10-CM

## 2020-10-05 DIAGNOSIS — F10280 Alcohol dependence with alcohol-induced anxiety disorder: Secondary | ICD-10-CM | POA: Diagnosis not present

## 2020-10-07 ENCOUNTER — Encounter (HOSPITAL_COMMUNITY): Payer: Self-pay | Admitting: Medical

## 2020-10-07 ENCOUNTER — Other Ambulatory Visit (HOSPITAL_COMMUNITY): Payer: BC Managed Care – PPO | Admitting: Licensed Clinical Social Worker

## 2020-10-07 ENCOUNTER — Other Ambulatory Visit: Payer: Self-pay

## 2020-10-07 DIAGNOSIS — T7412XS Child physical abuse, confirmed, sequela: Secondary | ICD-10-CM

## 2020-10-07 DIAGNOSIS — F1910 Other psychoactive substance abuse, uncomplicated: Secondary | ICD-10-CM

## 2020-10-07 DIAGNOSIS — F1994 Other psychoactive substance use, unspecified with psychoactive substance-induced mood disorder: Secondary | ICD-10-CM

## 2020-10-07 DIAGNOSIS — F341 Dysthymic disorder: Secondary | ICD-10-CM

## 2020-10-07 DIAGNOSIS — F17210 Nicotine dependence, cigarettes, uncomplicated: Secondary | ICD-10-CM

## 2020-10-07 DIAGNOSIS — F10280 Alcohol dependence with alcohol-induced anxiety disorder: Secondary | ICD-10-CM | POA: Diagnosis not present

## 2020-10-07 DIAGNOSIS — F4312 Post-traumatic stress disorder, chronic: Secondary | ICD-10-CM

## 2020-10-07 DIAGNOSIS — Z0282 Encounter for adoption services: Secondary | ICD-10-CM

## 2020-10-07 DIAGNOSIS — Z811 Family history of alcohol abuse and dependence: Secondary | ICD-10-CM

## 2020-10-07 DIAGNOSIS — F1020 Alcohol dependence, uncomplicated: Secondary | ICD-10-CM

## 2020-10-07 DIAGNOSIS — F10980 Alcohol use, unspecified with alcohol-induced anxiety disorder: Secondary | ICD-10-CM

## 2020-10-07 HISTORY — DX: Post-traumatic stress disorder, chronic: F43.12
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
## 2020-10-08 ENCOUNTER — Other Ambulatory Visit (HOSPITAL_COMMUNITY): Payer: BC Managed Care – PPO

## 2020-10-08 ENCOUNTER — Other Ambulatory Visit: Payer: Self-pay

## 2020-10-11 NOTE — Progress Notes (Signed)
    Daily Group Progress Note  Program: CD-IOP    Group Time: 1pm-2:30pm Participation Level: Active Behavioral Response: Appropriate and Sharing Type of Therapy: Process Group Topic: Clinician checked in with group members, assessed for SI/HI/psychosis and overall level of functioning. Clinician inquired about sobriety date and number of community support meetings attended since last session. Clinician and group members processed events that went well and any challenges to recovery. Clinician and group members explored coping skills used to address challenges and problem solved additional options for next time a similar challenge arises. Clinician and group members read AA Daily Reflection and NA Just For Today and reflected on meaning related to current place in recovery. Clinician provided members with mindfulness activity.  Group Time: 2:30pm-4pm Participation Level: Active Behavioral Response: Appropriate and Sharing Type of Therapy: Psychoeducational Group Topic Clinician provided psycho-education for Co-occurring Disorders Treatment Workbook on 'Identifying High Risk Situations for Substance Abuse and Mental Health Relapse.' Clinician utilized Motivational interviewing and supplemental material from [employer] Manual to identify internal and external triggers. Clinician reviewed cognitive triangle and connection of thoughts, feelings, and behaviors. Clinician encouraged group members to utilize outside support system to help identify personal warning signs possibly related to relapse. Clinician provided clients with 'Coping with Cravings and Urges' and encouraged daily practice of skills. Clinician inquired about self care activity to support recovery to be completed prior to next group.   Summary: Client identified employment and finances as primary stressors currently. Client shared with group plan to coping with stressors which arise at work including talking with his girlfriend. Client  reports returning to work will decrease worry about finances. Client was able to identified several internal and external triggers to be managed for both mental health and substance use. Client showed progress toward goal of achieving and maintaining sobriety daily AEB sobriety date remained the same. Client showed progress toward building sober support community AEB attending virtual AA meetings. Client has a goal to make progress toward sharing thoughts/feelings before feeling too elevated to cope effectively.    Family Program: Family present? No   Name of family member(s): NA  UDS collected: No Results: pending  AA/NA attended?: Yes  Sponsor?: No   Harlon Ditty, LCSW

## 2020-10-11 NOTE — Progress Notes (Signed)
    Daily Group Progress Note  Program: CD-IOP   Group Time: 1pm-2:30pm Participation Level: Active Behavioral Response: Appropriate Type of Therapy: Process Group Topic:  Clinician checked in with group members, assessing for SI/HI/psychosis and overall level of functioning. Clinician and group members processed moments of success and barriers to maintaining recovery since previous group. Clinician and group members read and processed Daily Reflection and how it reflects on current place in recovery. Clinician and group members completed Loving Kindness Meditation.     Group Time: 2:30pm-4pm Participation Level: Active Behavioral Response: Appropriate Type of Therapy: Psycho-education Group Topic: Clinician presented the topic of Kristin Neff's self-compassion and skills focused on supporting positive view of self, avoiding excessive self-criticism, and finding common humanity using gratitude and self-soothing to cope with difficult situations with limited control. Clinician and group members reviewed differences in self-esteem vs self-compassion. Group members discussed common humanity and suffering in the context of engaging with AA/NA groups. Clinician presented self-esteem journal encouraging clients to use this as a tool to identify positive moments and moments of gratitude throughout the week.    Summary: Client presented with progress toward goal of achieving and maintaining sobriety 7/7 days weekly AEB sobriety date remained 09/07/20. Client reported started to attend virtual AA meetings over the weekend which showed progress toward goal of building sober support community. Client remained hesitant of attending in person meetings at this time. Client reported limited stressors over the weekend and continued working on relapse prevention plan including attempting to maintain schedule which included mindful time with partner and walking. Client processed with group passive aggressive  interaction with partner and later healthy resolution to concern. Client noted passive aggressive communication is a point of growth necessary for his recovery. Client was receptive to psycho-education and identified personal benefits in recovery to increasing self compassion.   Family Program: Family present? No   Name of family member(s): NA  UDS collected: Yes Results: pending  AA/NA attended?: Yes  Sponsor?: No   Harlon Ditty, LCSW

## 2020-10-12 ENCOUNTER — Encounter (HOSPITAL_COMMUNITY): Payer: BC Managed Care – PPO

## 2020-10-14 ENCOUNTER — Other Ambulatory Visit: Payer: Self-pay

## 2020-10-14 ENCOUNTER — Other Ambulatory Visit (HOSPITAL_COMMUNITY): Payer: BC Managed Care – PPO | Admitting: Licensed Clinical Social Worker

## 2020-10-14 DIAGNOSIS — F1020 Alcohol dependence, uncomplicated: Secondary | ICD-10-CM

## 2020-10-14 DIAGNOSIS — F1994 Other psychoactive substance use, unspecified with psychoactive substance-induced mood disorder: Secondary | ICD-10-CM

## 2020-10-14 DIAGNOSIS — F10280 Alcohol dependence with alcohol-induced anxiety disorder: Secondary | ICD-10-CM | POA: Diagnosis not present

## 2020-10-14 DIAGNOSIS — F4312 Post-traumatic stress disorder, chronic: Secondary | ICD-10-CM

## 2020-10-15 ENCOUNTER — Other Ambulatory Visit (HOSPITAL_COMMUNITY): Payer: BC Managed Care – PPO | Admitting: Licensed Clinical Social Worker

## 2020-10-15 DIAGNOSIS — F4312 Post-traumatic stress disorder, chronic: Secondary | ICD-10-CM

## 2020-10-15 DIAGNOSIS — F1994 Other psychoactive substance use, unspecified with psychoactive substance-induced mood disorder: Secondary | ICD-10-CM

## 2020-10-15 DIAGNOSIS — F1020 Alcohol dependence, uncomplicated: Secondary | ICD-10-CM

## 2020-10-15 DIAGNOSIS — F10280 Alcohol dependence with alcohol-induced anxiety disorder: Secondary | ICD-10-CM | POA: Diagnosis not present

## 2020-10-19 ENCOUNTER — Other Ambulatory Visit (HOSPITAL_COMMUNITY): Payer: BC Managed Care – PPO | Attending: Psychiatry | Admitting: Licensed Clinical Social Worker

## 2020-10-19 ENCOUNTER — Encounter (HOSPITAL_COMMUNITY): Payer: Self-pay

## 2020-10-19 ENCOUNTER — Other Ambulatory Visit: Payer: Self-pay

## 2020-10-19 DIAGNOSIS — F1994 Other psychoactive substance use, unspecified with psychoactive substance-induced mood disorder: Secondary | ICD-10-CM

## 2020-10-19 DIAGNOSIS — Z6372 Alcoholism and drug addiction in family: Secondary | ICD-10-CM | POA: Insufficient documentation

## 2020-10-19 DIAGNOSIS — F1914 Other psychoactive substance abuse with psychoactive substance-induced mood disorder: Secondary | ICD-10-CM | POA: Insufficient documentation

## 2020-10-19 DIAGNOSIS — F1020 Alcohol dependence, uncomplicated: Secondary | ICD-10-CM | POA: Insufficient documentation

## 2020-10-19 DIAGNOSIS — G43909 Migraine, unspecified, not intractable, without status migrainosus: Secondary | ICD-10-CM | POA: Diagnosis not present

## 2020-10-19 DIAGNOSIS — F4312 Post-traumatic stress disorder, chronic: Secondary | ICD-10-CM | POA: Insufficient documentation

## 2020-10-19 DIAGNOSIS — F17210 Nicotine dependence, cigarettes, uncomplicated: Secondary | ICD-10-CM | POA: Diagnosis not present

## 2020-10-19 DIAGNOSIS — F10280 Alcohol dependence with alcohol-induced anxiety disorder: Secondary | ICD-10-CM | POA: Diagnosis present

## 2020-10-19 NOTE — Progress Notes (Signed)
    Daily Group Progress Note  Program: CD-IOP   Group Time: 1-2:30pm Participation Level: Active Behavioral Response: Appropriate Type of Therapy: Process Group Topic: Clinician checked in with group members, assessing for SI/HI/psychosis and overall level of functioning, including cravings, threats to sobriety relapse, and number of community support groups attended since last session. Clinician and group members processed 'highs' and 'lows' since last group any challenges to recovery. Clinician utilized motivational interviewing OARS for deeper processing. Clinician and group members read JFT and AA Daily meditation and processed topics in relation to current place in recovery.  Group Time: 2:30pm-4pm Participation Level: Active Behavioral Response: Appropriate Type of Therapy: Process Group Topic: Clinician presented mindfulness activity 'my mask' working with clients on creating and explaining how we view self vs how we present to the world. Clinician provided psycho-educational information on Dysfunctional Family Roles and the Family and family 'Culture' related to substance abuse. Clinician and group members discussed reasons for roles including ability to rational, denial, avoidance of pain, and excuses to continue behaviors. Clinician reviewed roles and discussed with clients roles in current family as the chemically dependent person and any roles in family of origin. Clinician discussed the importance of identifying strengths of identified role and behaviors to be addressed to avoid unhealthy behaviors such as not getting needs met and resulting resentment as discussed in previous group. Clinician inquired about self care activity to support recovery to be completed before next group.  Summary: Client checked in showing progress toward goal of maintaining sobriety AEB sobriety date of 09/07/20. Client processed with group members follow through with response from previous day and passive  aggressive communication. Client identified overall healthy family growing up however noted he was adopted and his biological parents did have trouble with addiction. Client was able to identify some friends/coworkers as enablers when role re-framed as avoiding severity of consequences. Client shared decision made on high risk job situation not being worth the risk to his recovery at this time.   Family Program: Family present? No   Name of family member(s): NA  UDS collected: No Results: prescribed medication and nicotine  AA/NA attended?: No  Sponsor?: No   Olegario Messier, LCSW

## 2020-10-19 NOTE — Progress Notes (Signed)
    Daily Group Progress Note  Program: CD-IOP   Group Time: 1pm-2:30pm Participation Level: Active Behavioral Response: Appropriate Type of Therapy: Process Group Topic: Clinician checked in with group members, assessing for SI/HI/psychosis and overall level of functioning including relapse and barriers to recovery. Clinician and group members discussed highlights and struggles since last group related to maintaining sobriety including identified triggers and responses. Clinician and group members processed Daily Reflection and personal meeting to current work in recovery. Clinician and group members discussed "facing feelings" and explored the effect substances had on the intensity of emotions while in active addiction.      Group Time: 2:30pm-4pm Participation Level: Active Behavioral Response: Appropriate Type of Therapy: Psycho-education Group Topic: Clinician presented the psycho-educational topic of Willingness vs Willfulness. Clinician and group members defined willingness and willfulness and provided examples of recent moments of each related to addiction and sobriety. Clinician provided supplemental video on 'Willingness as an Antidote to Anxiety.' Clinician and group members discussed the practice of sitting in uncomfortable emotions or physical sensations and resisting the urge to act impulsively, specifically in relation to cravings in early recovery. Group members completed activity presented in video and clinician reviewed additional skill of Half-smiling and Willing hands. Clinician also provided 5-senses grounding activity and practiced in session with group members. Clinician and group celebrated graduation of one member providing supportive feedback to support recovery.   Summary: Client showed progress toward goal of achieving and maintaining sobriety AEB maintained sobriety date of 09/07/20. Client processed with group stressors and work and actions to take to avoid  excessive resentment building up. Client showed progress by sharing feelings with girlfriend rather than keeping to himself. Client identified a pattern of passive-aggressive communication with others rather than clearly stating needs and expectations.   Family Program: Family present? No   Name of family member(s): NA  UDS collected: Yes Results: prescribed medications and nicotine  AA/NA attended?: Yes 2  Sponsor?: No   Harlon Ditty, LCSW

## 2020-10-20 NOTE — Progress Notes (Signed)
    Daily Group Progress Note  Program: CD-IOP   Group Time: 1pm-3pm Participation Level: Active Behavioral Response: Appropriate Type of Therapy: Process Group Topic: Clinician checked in with group members, assessing for SI/HI/psychosis and overall level of functioning, including cravings or triggers and coping skills used to deal with uncomfortable feelings. Clinician and group members discussed what went well and challenges to sobriety over the weekend. Clinician praised group members utilizing relapse prevention skills. Clinician and group members processed triggers for relapse and accepting responsibility for role in situations. Clinician and group members processed guilt related to relapse and personal debates on safety when sharing cravings and use with support systems. Clinician and group members processed 'fairness' in relation to not being able to use substances like peers without losing control or having severe consequences. Clinician validated group thoughts and feelings and reminded the fallacy of fairness and the effect of distorted thinking on emotions and cravings. Group members processed AA Daily Reflection and NA, focused on "vacillating between feeling totally invisible and believing I was the center of the universe" and "guarding our recovery." Group members continued processing challenges  in making decisions based on current level of security in their recovery.  Group Time: 3pm-4pm Participation Level: Active Behavioral Response: Appropriate Type of Therapy: Psycho-education Group Topic: Clinician presented the psycho-educational topic of Adult Children of Alcoholics (ACOA). Clinician reviewed with clients the 'Laundry List' and common thought and behavior patterns of families with substance use in the home. Group members were provided with opportunities to identify where they identify some of the traits in their lives and the relation to dysfunctional family role reviewed in  previous session. Clinician inquired about a self-care activity to be completed prior to next session.   Summary: Client checked in with progress toward goal of maintaining sobriety AEB sobriety date of 09/07/20. Client showed progress toward goal of building sober support system AEB attending 1 AA meeting since last session however needs continued progress on attending in person meetings and gaining a direct support system. Client processed almost slip over the weekend related to physical distress. Client noted it is possible this is the first time he has felt sick sober and wanted instant relief. Client showed progress toward goal of utilizing coping skills AEB talking with girlfriend and physical activity prior to drinking. Client agreed in lack of fairness having to be more mindful than some of his peers but has come to accept he will likely die if he drinks again. Client was receptive to psycho-education and discussed having some of the traits despite not being raised by alcoholic parents.   Family Program: Family present? No   Name of family member(s): NA  UDS collected: Yes Results: pending  AA/NA attended?: Yes  Sponsor?: No   Harlon Ditty, LCSW

## 2020-10-21 ENCOUNTER — Other Ambulatory Visit: Payer: Self-pay

## 2020-10-21 ENCOUNTER — Other Ambulatory Visit (HOSPITAL_COMMUNITY): Payer: BC Managed Care – PPO | Admitting: Medical

## 2020-10-21 ENCOUNTER — Encounter (HOSPITAL_COMMUNITY): Payer: Self-pay | Admitting: Medical

## 2020-10-21 DIAGNOSIS — F1020 Alcohol dependence, uncomplicated: Secondary | ICD-10-CM

## 2020-10-21 DIAGNOSIS — F17210 Nicotine dependence, cigarettes, uncomplicated: Secondary | ICD-10-CM

## 2020-10-21 DIAGNOSIS — F10980 Alcohol use, unspecified with alcohol-induced anxiety disorder: Secondary | ICD-10-CM

## 2020-10-21 DIAGNOSIS — Z789 Other specified health status: Secondary | ICD-10-CM

## 2020-10-21 DIAGNOSIS — Z811 Family history of alcohol abuse and dependence: Secondary | ICD-10-CM

## 2020-10-21 DIAGNOSIS — F4312 Post-traumatic stress disorder, chronic: Secondary | ICD-10-CM

## 2020-10-21 DIAGNOSIS — Z0282 Encounter for adoption services: Secondary | ICD-10-CM

## 2020-10-21 DIAGNOSIS — F1910 Other psychoactive substance abuse, uncomplicated: Secondary | ICD-10-CM

## 2020-10-21 DIAGNOSIS — F1994 Other psychoactive substance use, unspecified with psychoactive substance-induced mood disorder: Secondary | ICD-10-CM

## 2020-10-21 DIAGNOSIS — T7412XS Child physical abuse, confirmed, sequela: Secondary | ICD-10-CM

## 2020-10-21 DIAGNOSIS — F341 Dysthymic disorder: Secondary | ICD-10-CM

## 2020-10-21 MED ORDER — PROPRANOLOL HCL 20 MG PO TABS: 20.0000 mg | ORAL_TABLET | Freq: Three times a day (TID) | ORAL | 2 refills | Status: DC
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
## 2020-10-22 ENCOUNTER — Other Ambulatory Visit: Payer: Self-pay

## 2020-10-22 ENCOUNTER — Other Ambulatory Visit (HOSPITAL_COMMUNITY): Payer: BC Managed Care – PPO | Admitting: Licensed Clinical Social Worker

## 2020-10-22 ENCOUNTER — Other Ambulatory Visit (HOSPITAL_COMMUNITY): Payer: Self-pay | Admitting: Medical

## 2020-10-22 DIAGNOSIS — F4312 Post-traumatic stress disorder, chronic: Secondary | ICD-10-CM

## 2020-10-22 DIAGNOSIS — F1020 Alcohol dependence, uncomplicated: Secondary | ICD-10-CM

## 2020-10-22 DIAGNOSIS — F1994 Other psychoactive substance use, unspecified with psychoactive substance-induced mood disorder: Secondary | ICD-10-CM

## 2020-10-25 NOTE — Progress Notes (Signed)
    Daily Group Progress Note  Program: CD-IOP   Group Time: 1pm-2:30pm Participation Level: Active Behavioral Response: Appropriate and Sharing Type of Therapy: Process Group Topic: Clinician checked in with group members, assessing for SI/HI/psychosis and overall level of functioning. Clinician and group shared sobriety date, and any community meetings attended. Clinician and group members processed recent barriers to recovery/sobriety and coping skills used in response. Clinicians read AA Daily Reflection and NA Just for today with a focus on "Value of the past" and use of stories between fellows for support. Clinician and group members completed self assessment, identifying areas in life which are going well and areas of life which could use support.      Group Time: 2:30-4pm Participation Level: Active Behavioral Response: Appropriate and Sharing Type of Therapy: Psycho-education Group Topic: Clinician facilitated mindfulness activity with acupressure to address anxiety. Clinician presented the topic of Stages of Change. Clinician and group members reviewed common behaviors in each stage of clients identified personal stage. Clinician provided screening for stages of change based on thought patterns for clients to utilize in the future to be mindful of thoughts/behaviors possibly related to relapse or change in stage. Clinician presented Dorise Bullion and discussed behaviors in chronic use vs recovery. Clinician and group members completed material from SMART recovery, focused on identifying pros and cons of continued and changed behaviors. Group members checked in with activity to support recovery prior to next session.   Summary: Client showed progress toward goal of achieving and maintaining sobriety AEB sobriety date reported of 09/07/20. Client shared completing similar self assessment in inpatient treatment, noting differences of now looking at longer term goals. Client reports  focusing on 'one day at a time' to get through uncomfortable emotions and is improving at sharing with girlfriend rather than bottling. Client notes continues to work on passive aggressive communication and is improving. Client participated in mindfulness activity and identified behaviors on jellinek curve which he was completing easily and others which require support. Client goal for self care over the weekend includes cooking with girlfriend and attending meetings.   Family Program: Family present? No   Name of family member(s): NA  UDS collected: No Results: prescribed medications and nicotine  AA/NA attended?: No  Sponsor?: No   Harlon Ditty, LCSW

## 2020-10-26 ENCOUNTER — Other Ambulatory Visit: Payer: Self-pay

## 2020-10-26 ENCOUNTER — Other Ambulatory Visit (HOSPITAL_COMMUNITY): Payer: BC Managed Care – PPO | Admitting: Licensed Clinical Social Worker

## 2020-10-26 DIAGNOSIS — F4312 Post-traumatic stress disorder, chronic: Secondary | ICD-10-CM

## 2020-10-26 DIAGNOSIS — F1994 Other psychoactive substance use, unspecified with psychoactive substance-induced mood disorder: Secondary | ICD-10-CM

## 2020-10-26 DIAGNOSIS — F1020 Alcohol dependence, uncomplicated: Secondary | ICD-10-CM

## 2020-10-28 ENCOUNTER — Other Ambulatory Visit (HOSPITAL_COMMUNITY): Payer: BC Managed Care – PPO | Admitting: Licensed Clinical Social Worker

## 2020-10-28 ENCOUNTER — Other Ambulatory Visit: Payer: Self-pay

## 2020-10-28 DIAGNOSIS — F1910 Other psychoactive substance abuse, uncomplicated: Secondary | ICD-10-CM

## 2020-10-28 DIAGNOSIS — F4312 Post-traumatic stress disorder, chronic: Secondary | ICD-10-CM

## 2020-10-28 DIAGNOSIS — F1020 Alcohol dependence, uncomplicated: Secondary | ICD-10-CM

## 2020-10-29 ENCOUNTER — Other Ambulatory Visit: Payer: Self-pay

## 2020-10-29 ENCOUNTER — Other Ambulatory Visit (HOSPITAL_COMMUNITY): Payer: BC Managed Care – PPO | Admitting: Licensed Clinical Social Worker

## 2020-10-30 NOTE — Progress Notes (Signed)
    Daily Group Progress Note  Program: CD-IOP      Group Time: 1-2:30pm Participation Level: Active  Behavioral Response: Appropriate Type of Therapy: Process Group  Topic: Clinician checked in with group members, assessing for SI/HI/psychosis and overall level of functioning, including cravings, relapse, and participation in community support meetings. Clinician explored with clients identifying and processing 'highs' and 'lows' since last group and processed any 'roadblocks' to recovery. Clinician and group members processed guilt and shame which comes with relapse, primarily from self. Clinician and group members discussed role of boredom in recovery and brainstormed activities to fill time. Group members also discussed coping with lack of motivation in early recovery and increased sensitivity to physical and emotional pain. Clinician and group members read and processed AA's Daily Reflection and NA's Just For Today focused on blame. Clients discussed the process of acceptance of addiction and accepting responsibility for own behaviors.   Group Time: 2:30pm-4pm Participation Level:  Active Behavioral Response: Appropriate Type of Therapy: Psycho-education Group Topic: Clinician presented the topic of Codependence with supportive information from The Substance Abuse & Recovery Workbook. Clients completed Codependency scale. Clinician and group members reviewed descriptions for caretaking,  self-worth, and dependency. Clinician and group members reviewed how behaviors could present in current life including discussing Motivations for behaviors, and cost/benefit of caretaking vs enabling. Clinician presented attachment styles and effect on relationships, work, and willingness to accept help. Clinician requested group members identify one self care activity to support recovery to be completed prior to next session.   Summary: Client checked in showing progress toward goal AEB maintained  sobriety date of 09/07/20 however did not attend any support meetings over the weekend. Client stated improvement on completing tasks to support his recovery no matter girlfriends involvement or not. Client discussed improvement since becoming sober with blaming others for problems and improved willingness to accept responsibility for only his piece in a problem. Client discussed with group members the effect of boredom on thoughts and behaviors. Client was receptive to psycho-education on codependency and noted surprised at numbers being higher than expected.   Family Program: Family present? No   Name of family member(s): NA  UDS collected: Yes Results: pending  AA/NA attended?: No 0  Sponsor?: No   Harlon Ditty, LCSW

## 2020-11-02 ENCOUNTER — Other Ambulatory Visit (HOSPITAL_COMMUNITY): Payer: BC Managed Care – PPO

## 2020-11-02 ENCOUNTER — Other Ambulatory Visit: Payer: Self-pay

## 2020-11-03 NOTE — Progress Notes (Incomplete)
    Daily Group Progress Note  Program: CD-IOP   Group Time: 1pm-2pm Participation Level: Active Behavioral Response: Appropriate and Sharing Type of Therapy: Process Group Topic: Clinician checked in with group members, assessing for SI/HI/psychosis and overall level of functioning including difficulties with cravings or relapse. Clinician and group members processed 'highs and lows' since last group. Group members were provided time to process challenges to recovery and receive feedback from group members. Clinician and group members read AA Daily Reflection and Just for Today   Group Time: 2pm-4pm Participation Level: Active Behavioral Response: Appropriate Type of Therapy: Psycho-education Group Topic: Clinician presented the topic of resentment and forgiveness and its possible effect on relapse in recovery. Clinician and group members discussed situations leading to resentment, additional feelings, thoughts, and behaviors related to resentment. Clinician and group members identified core believes and processed how a violation of these by self or others can lead to guilt or resentment. Clinician and group members processed violating boundaries is more likely when actively using substances. Clinician and group members completed worksheet identifying emotions linked with resentment, thoughts about self and others developed based on events supporting resentment. Clinician facilitated Empty Chair activity walking through what each group member is getting out of holding onto resentment and the changes in thought patterns resulting from forgiveness. Clinician and clients discussed acceptance vs forgiveness for situations out of personal control.  Summary: ***   Family Program: Family present? No   Name of family member(s): ***  UDS collected: No Results: {Findings; urine drug screen:60936}  AA/NA attended?: No{DAYS OF WCBJ:62831}  Sponsor?: No   Harlon Ditty, LCSW
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
## 2020-11-04 ENCOUNTER — Other Ambulatory Visit: Payer: Self-pay

## 2020-11-04 ENCOUNTER — Encounter (HOSPITAL_COMMUNITY): Payer: Self-pay | Admitting: Medical

## 2020-11-04 ENCOUNTER — Other Ambulatory Visit (HOSPITAL_COMMUNITY): Payer: BC Managed Care – PPO | Admitting: Licensed Clinical Social Worker

## 2020-11-04 DIAGNOSIS — F1020 Alcohol dependence, uncomplicated: Secondary | ICD-10-CM

## 2020-11-04 DIAGNOSIS — T7412XS Child physical abuse, confirmed, sequela: Secondary | ICD-10-CM

## 2020-11-04 DIAGNOSIS — F10980 Alcohol use, unspecified with alcohol-induced anxiety disorder: Secondary | ICD-10-CM

## 2020-11-04 DIAGNOSIS — F1994 Other psychoactive substance use, unspecified with psychoactive substance-induced mood disorder: Secondary | ICD-10-CM

## 2020-11-04 DIAGNOSIS — T7432XS Child psychological abuse, confirmed, sequela: Secondary | ICD-10-CM

## 2020-11-04 DIAGNOSIS — F1910 Other psychoactive substance abuse, uncomplicated: Secondary | ICD-10-CM

## 2020-11-04 DIAGNOSIS — Z811 Family history of alcohol abuse and dependence: Secondary | ICD-10-CM

## 2020-11-04 DIAGNOSIS — F17210 Nicotine dependence, cigarettes, uncomplicated: Secondary | ICD-10-CM

## 2020-11-04 DIAGNOSIS — Z0282 Encounter for adoption services: Secondary | ICD-10-CM

## 2020-11-04 DIAGNOSIS — F4312 Post-traumatic stress disorder, chronic: Secondary | ICD-10-CM

## 2020-11-04 DIAGNOSIS — F341 Dysthymic disorder: Secondary | ICD-10-CM
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
## 2020-11-05 ENCOUNTER — Encounter (HOSPITAL_COMMUNITY): Payer: BC Managed Care – PPO

## 2020-11-05 NOTE — Progress Notes (Signed)
    Daily Group Progress Note  Program: CD-IOP     Group Time: 1pm-2pm Participation Level: Active Behavioral Response: Appropriate Type of Therapy: Psycho-education Group Topic: Psychoeducational presentation co-facilitated by Berna Spare. from [facility]. STI and HIV psychoeducation was provided to clients. Clients were provided with time to ask questions and discuss local resources.     Group Time: 2pm-4pm Participation Level: Active Behavioral Response: Appropriate Type of Therapy: Process Group Topic: Process group: Clinician checked in with clients, assessing for SI/HI/psychosis and overall level of functioning including cravings, barriers to sobriety, and highlights when skills were successfully used. Clinician and group processed Daily Reflection and Just For Today with a focus on self-examination and detachment. Group members processed being the ones to provide insight into treatment and recovery rather than being on the receiving end. Clinician presented Tapping as a skill for managing uncomfortable emotions and addressing negative self-talk and thoughts. Clinician facilitated discussion on acknowledgement of negative self-talk and working toward acceptance of all parts of self. Clinician facilitated processing feelings of un-worthiness in relation to recovery being a selfish process and the need to accept help when offered, even if difficult. Clients provided support for others' progress and solutions to roadblocks. Clinician inquired about self-care activity to be completed before next group.   Summary: Client presented fully oriented with a reported sobriety date of 09/07/20 from alcohol. Client appeared to have decreased intensities of self criticism for use of g/fs medication previous week. Client shared challenges of staying in [place] and feeling good about sharing good example of sobriety with others. Client encouraged group member to accept help while available in relation  to work and finances. Client showed progress toward goal AEB maintaining sobriety   Family Program: Family present? No   Name of family member(s): NA  UDS collected: Yes Results: pending  AA/NA attended?: No 0  Sponsor?: No   Harlon Ditty, LCSW

## 2020-11-09 ENCOUNTER — Other Ambulatory Visit (HOSPITAL_COMMUNITY): Payer: BC Managed Care – PPO | Admitting: Licensed Clinical Social Worker

## 2020-11-09 ENCOUNTER — Other Ambulatory Visit: Payer: Self-pay

## 2020-11-09 DIAGNOSIS — F1994 Other psychoactive substance use, unspecified with psychoactive substance-induced mood disorder: Secondary | ICD-10-CM

## 2020-11-09 DIAGNOSIS — F4312 Post-traumatic stress disorder, chronic: Secondary | ICD-10-CM

## 2020-11-09 DIAGNOSIS — F1020 Alcohol dependence, uncomplicated: Secondary | ICD-10-CM

## 2020-11-10 NOTE — Progress Notes (Signed)
    Daily Group Progress Note  Program: CD-IOP   Group Time: 1pm-2:30pm  Participation Level: Active Behavioral Response: Appropriate Type of Therapy: Process Group Topic: Clinician checked in with group members, assessing for SI/HI/psychosis and overall level of functioning including relapse and barriers to recovery. Clinician and group members discussed highlights and struggles since last group related to maintaining sobriety including identified triggers and responses. Clinician and group members processed and provided feedback related to barriers to recovery process. Clinician and group members processed Daily Reflection and personal meaning to current work in recovery with a focus on resistance and 'embracing' vs 'accepting' the reality of addiction and sobriety.    Group Time: 2:30pm-4pm Participation Level: Active Behavioral Response: Appropriate Type of Therapy: Process/Psycho-education Group Topic: Clinician presented 'Letter From My Disease,' poem written from perspective of addiction and how it can manifest, as well as strategies for relapse prevention. Clinician and group processed thoughts and feelings related to poem mirroring their experience with addiction. Clinician provided outline for clients to complete 'Goodbye to My Addiction' letter. Group members completed mindful journaling activity and some members read letters aloud. Clinician facilitated processing of experience writing and reading personal letter, praising group members for interpersonal feedback and support. Clinician provided mindful stretching activity for distress tolerance which could be added into daily routine. Clinician inquired about a self-care activity supporting recovery which would be completed before returning to next group.   Summary: Client showed progress toward goal AEB reported maintained sobriety date of 09/07/20, supported by negative UDS. Client reported some challenges with being bored over the  weekend and ongoing mild but manageable cravings. Client decreased dose of baclafen which appeared to resolve migraines which had been a trigger for client. Client reported getting more comfortable with going and doing things to keep himself healthy, despite girlfriend being involved or not, such as spending time outside or walking. Client engaged in discussion around changing in thinking about relationship with alcohol being permanent, not temporary as 'rate brain' would like to think. Client completed goodbye letter and reported plans to work on adding to it when more time was available. Client showed progress toward goal of using distraction skills for distress tolerance, improving emotional regulation. Client noted still fluctuating mood but improvement with management.   Family Program: Family present? No   Name of family member(s): NA  UDS collected: Yes Results: previous week uds positive for prescribed medications only  AA/NA attended?: Yes; half a mtg before receiving important phone call  Sponsor?: No   Harlon Ditty, LCSW

## 2020-11-11 ENCOUNTER — Other Ambulatory Visit (HOSPITAL_COMMUNITY): Payer: BC Managed Care – PPO | Admitting: Licensed Clinical Social Worker

## 2020-11-11 ENCOUNTER — Other Ambulatory Visit: Payer: Self-pay

## 2020-11-11 DIAGNOSIS — F1020 Alcohol dependence, uncomplicated: Secondary | ICD-10-CM

## 2020-11-12 ENCOUNTER — Emergency Department (HOSPITAL_COMMUNITY)
Admission: EM | Admit: 2020-11-12 | Discharge: 2020-11-12 | Disposition: A | Discharge status: Home / Self Care | Payer: BC Managed Care – PPO | Source: Home / Self Care | Attending: Emergency Medicine | Admitting: Emergency Medicine

## 2020-11-12 ENCOUNTER — Other Ambulatory Visit: Payer: Self-pay

## 2020-11-12 ENCOUNTER — Other Ambulatory Visit (INDEPENDENT_AMBULATORY_CARE_PROVIDER_SITE_OTHER): Payer: BC Managed Care – PPO | Admitting: Medical

## 2020-11-12 ENCOUNTER — Encounter (HOSPITAL_COMMUNITY): Payer: Self-pay

## 2020-11-12 ENCOUNTER — Encounter (HOSPITAL_COMMUNITY): Payer: Self-pay | Admitting: Medical

## 2020-11-12 DIAGNOSIS — F341 Dysthymic disorder: Secondary | ICD-10-CM

## 2020-11-12 DIAGNOSIS — Z811 Family history of alcohol abuse and dependence: Secondary | ICD-10-CM

## 2020-11-12 DIAGNOSIS — F4312 Post-traumatic stress disorder, chronic: Secondary | ICD-10-CM | POA: Diagnosis not present

## 2020-11-12 DIAGNOSIS — G44009 Cluster headache syndrome, unspecified, not intractable: Secondary | ICD-10-CM

## 2020-11-12 DIAGNOSIS — F17210 Nicotine dependence, cigarettes, uncomplicated: Secondary | ICD-10-CM

## 2020-11-12 DIAGNOSIS — T7412XS Child physical abuse, confirmed, sequela: Secondary | ICD-10-CM

## 2020-11-12 DIAGNOSIS — M316 Other giant cell arteritis: Secondary | ICD-10-CM

## 2020-11-12 DIAGNOSIS — F1020 Alcohol dependence, uncomplicated: Secondary | ICD-10-CM

## 2020-11-12 DIAGNOSIS — R519 Headache, unspecified: Secondary | ICD-10-CM | POA: Insufficient documentation

## 2020-11-12 DIAGNOSIS — F1994 Other psychoactive substance use, unspecified with psychoactive substance-induced mood disorder: Secondary | ICD-10-CM

## 2020-11-12 DIAGNOSIS — Z0282 Encounter for adoption services: Secondary | ICD-10-CM

## 2020-11-12 DIAGNOSIS — F1910 Other psychoactive substance abuse, uncomplicated: Secondary | ICD-10-CM

## 2020-11-12 DIAGNOSIS — F10980 Alcohol use, unspecified with alcohol-induced anxiety disorder: Secondary | ICD-10-CM

## 2020-11-12 HISTORY — DX: Alcohol abuse, uncomplicated: F10.10

## 2020-11-12 LAB — COMPREHENSIVE METABOLIC PANEL
ALT: 17 U/L (ref 0–44)
AST: 17 U/L (ref 15–41)
Albumin: 4.7 g/dL (ref 3.5–5.0)
Alkaline Phosphatase: 59 U/L (ref 38–126)
Anion gap: 8 (ref 5–15)
BUN: 14 mg/dL (ref 6–20)
CO2: 24 mmol/L (ref 22–32)
Calcium: 9.2 mg/dL (ref 8.9–10.3)
Chloride: 106 mmol/L (ref 98–111)
Creatinine, Ser: 0.68 mg/dL (ref 0.61–1.24)
GFR, Estimated: >60 mL/min (ref >60)
Glucose, Bld: 105 mg/dL — ABNORMAL HIGH (ref 70–99)
Potassium: 4 mmol/L (ref 3.5–5.1)
Sodium: 138 mmol/L (ref 135–145)
Total Bilirubin: 0.6 mg/dL (ref 0.3–1.2)
Total Protein: 7.8 g/dL (ref 6.5–8.1)

## 2020-11-12 LAB — CBC
HCT: 46.2 % (ref 39.0–52.0)
Hemoglobin: 15.3 g/dL (ref 13.0–17.0)
MCH: 32.2 pg (ref 26.0–34.0)
MCHC: 33.1 g/dL (ref 30.0–36.0)
MCV: 97.3 fL (ref 80.0–100.0)
Platelets: 312 10*3/uL (ref 150–400)
RBC: 4.75 MIL/uL (ref 4.22–5.81)
RDW: 11.9 % (ref 11.5–15.5)
WBC: 7.5 10*3/uL (ref 4.0–10.5)
nRBC: 0 % (ref 0.0–0.2)

## 2020-11-12 MED ORDER — NAPROXEN 500 MG PO TABS: 500.0000 mg | ORAL_TABLET | Freq: Once | ORAL | Status: DC

## 2020-11-12 MED ORDER — ACETAMINOPHEN 325 MG PO TABS: 650.0000 mg | ORAL_TABLET | Freq: Once | ORAL | Status: DC

## 2020-11-12 MED ORDER — IBUPROFEN 600 MG PO TABS: 600.0000 mg | ORAL_TABLET | Freq: Three times a day (TID) | ORAL | 0 refills | Status: DC

## 2020-11-12 MED ORDER — ACETAMINOPHEN ER 650 MG PO TBCR: 650.0000 mg | EXTENDED_RELEASE_TABLET | Freq: Three times a day (TID) | ORAL | 0 refills | Status: DC | PRN

## 2020-11-12 NOTE — Discharge Instructions (Signed)
We suspect that you are having cluster headaches. For now, we recommend that you take the medications as prescribed and call the neurologist for the closest appointment if the headaches continue to cause significant disability.  Please return to the ER if the headache gets severe and in not improving, you have associated new one sided numbness, tingling, weakness or confusion, seizures, poor balance or poor vision.

## 2020-11-12 NOTE — ED Triage Notes (Signed)
Pt reports intermittent migraine x1 month and mild nausea. Pt reports being sent here by PCP to rule out temporal arteritis.

## 2020-11-12 NOTE — ED Provider Notes (Signed)
MSE was initiated and I personally evaluated the patient and placed orders (if any) at  3:50 PM on November 12, 2020.  The patient appears stable so that the remainder of the MSE may be completed by another provider.   Chief Complaint: migraines   HPI:  pt started having migraines one month ago after getting out of rehab for alcohol. pcp sent here to r/o temporal arteritis. No vision changes. Having HA now, but states that it is not migraine right now. No nausea or vomiting currently.    ROS: headache   Physical Exam:  Gen:                Awake, no distress  HEENT:          Atraumatic  Resp:              Normal effort  Cardiac:          Normal rate  Abd:                Nondistended, nontender  MSK:              Moves extremities without difficulty  Neuro:            Speech clear,EOMS intact, normal gait, good strength to upper and lower extremity      Initiation of care has begun. The patient has been counseled on the process, plan, and necessity for staying for the completion/evaluation, and the remainder of the medical screening examination    Farrel Gordon, PA-C 11/12/20 1553    Mancel Bale, MD 11/13/20 [phone]

## 2020-11-12 NOTE — ED Notes (Signed)
An After Visit Summary was printed and given to the patient. Discharge instructions given and no further questions at this time.  

## 2020-11-12 NOTE — Progress Notes (Signed)
Patient ID: Ricky Ford, male   DOB: 1985/11/09, 35 y.o.   MRN: [id number] Duplicate

## 2020-11-12 NOTE — Progress Notes (Signed)
Patient ID: Ricky Ford, male   DOB: 11-Apr-1986, 34 y.o.   MRN: [id number] S- IOP Pt c/o severe worsening Rt temporal and  neck pain. Associtated N&V,visual disturbance and Rt cranial nerve symtopms (tongue/pharynx)Symptom onset has been acute for a time period of  7 DAYS WORSENING PAST 3 DAYS. Severity is described as severe to profound. Course of his symptoms over time is worsening.   O- Swollen/ tender Rt temporal artery region. No pulse clearly felt. EOMI VFBC - No Opthalmoscop[e available.Neck exam negative for spasm nodes.  A-R/o Temporal arteritis  P-Contact PCP emergently/go to ER   Addendum UDS negative

## 2020-11-16 ENCOUNTER — Other Ambulatory Visit: Payer: Self-pay

## 2020-11-16 ENCOUNTER — Encounter (HOSPITAL_COMMUNITY): Payer: Self-pay | Admitting: Medical

## 2020-11-16 ENCOUNTER — Other Ambulatory Visit (HOSPITAL_COMMUNITY): Payer: BC Managed Care – PPO | Attending: Psychiatry | Admitting: Medical

## 2020-11-16 ENCOUNTER — Encounter (HOSPITAL_COMMUNITY): Payer: Self-pay | Admitting: Licensed Clinical Social Worker

## 2020-11-16 DIAGNOSIS — F341 Dysthymic disorder: Secondary | ICD-10-CM | POA: Diagnosis not present

## 2020-11-16 DIAGNOSIS — F1910 Other psychoactive substance abuse, uncomplicated: Secondary | ICD-10-CM

## 2020-11-16 DIAGNOSIS — F1914 Other psychoactive substance abuse with psychoactive substance-induced mood disorder: Secondary | ICD-10-CM | POA: Insufficient documentation

## 2020-11-16 DIAGNOSIS — F17210 Nicotine dependence, cigarettes, uncomplicated: Secondary | ICD-10-CM | POA: Insufficient documentation

## 2020-11-16 DIAGNOSIS — T7412XS Child physical abuse, confirmed, sequela: Secondary | ICD-10-CM

## 2020-11-16 DIAGNOSIS — T7432XS Child psychological abuse, confirmed, sequela: Secondary | ICD-10-CM

## 2020-11-16 DIAGNOSIS — R519 Headache, unspecified: Secondary | ICD-10-CM

## 2020-11-16 DIAGNOSIS — F4312 Post-traumatic stress disorder, chronic: Secondary | ICD-10-CM | POA: Insufficient documentation

## 2020-11-16 DIAGNOSIS — F1020 Alcohol dependence, uncomplicated: Secondary | ICD-10-CM

## 2020-11-16 DIAGNOSIS — F10280 Alcohol dependence with alcohol-induced anxiety disorder: Secondary | ICD-10-CM | POA: Diagnosis not present

## 2020-11-16 DIAGNOSIS — Z811 Family history of alcohol abuse and dependence: Secondary | ICD-10-CM

## 2020-11-16 DIAGNOSIS — Z0282 Encounter for adoption services: Secondary | ICD-10-CM

## 2020-11-16 DIAGNOSIS — F1994 Other psychoactive substance use, unspecified with psychoactive substance-induced mood disorder: Secondary | ICD-10-CM

## 2020-11-16 DIAGNOSIS — F10980 Alcohol use, unspecified with alcohol-induced anxiety disorder: Secondary | ICD-10-CM

## 2020-11-16 MED ORDER — DEXAMETHASONE 1.5 MG (51) PO TBPK: ORAL_TABLET | ORAL | 0 refills | Status: DC

## 2020-11-16 MED ORDER — FAMOTIDINE 40 MG PO TABS: 40.0000 mg | ORAL_TABLET | Freq: Every evening | ORAL | 1 refills | Status: DC

## 2020-11-16 NOTE — Progress Notes (Signed)
Client unable to attend group due to migraine. Will attempt to return to next scheduled group session. Client request to speak with PA at next scheduled session. Client plans to attend urgent care clinic if migraine does not ease as he has missed several days of work due to this concern.
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
## 2020-11-17 ENCOUNTER — Other Ambulatory Visit: Payer: Self-pay

## 2020-11-17 ENCOUNTER — Ambulatory Visit: Payer: BC Managed Care – PPO | Admitting: Neurology

## 2020-11-17 ENCOUNTER — Encounter: Payer: Self-pay | Admitting: Neurology

## 2020-11-17 VITALS — BP 131/82 | HR 90 | Ht 71.0 in | Wt 175.5 lb

## 2020-11-17 DIAGNOSIS — G43709 Chronic migraine without aura, not intractable, without status migrainosus: Secondary | ICD-10-CM | POA: Insufficient documentation

## 2020-11-17 MED ORDER — RIZATRIPTAN BENZOATE 10 MG PO TBDP: 10.0000 mg | ORAL_TABLET | ORAL | 6 refills | Status: DC | PRN

## 2020-11-17 MED ORDER — PROPRANOLOL HCL 20 MG PO TABS: 60.0000 mg | ORAL_TABLET | Freq: Two times a day (BID) | ORAL | 11 refills | Status: DC

## 2020-11-17 MED ORDER — ONDANSETRON 4 MG PO TBDP: 4.0000 mg | ORAL_TABLET | Freq: Three times a day (TID) | ORAL | 6 refills | Status: DC | PRN

## 2020-11-17 MED ORDER — PROPRANOLOL HCL 60 MG PO TABS: 60.0000 mg | ORAL_TABLET | Freq: Two times a day (BID) | ORAL | 4 refills | Status: DC

## 2020-11-17 MED ORDER — TIZANIDINE HCL 4 MG PO TABS: 4.0000 mg | ORAL_TABLET | Freq: Four times a day (QID) | ORAL | 6 refills | Status: DC | PRN

## 2020-11-17 NOTE — Progress Notes (Signed)
   History: Few months history of right occipital area pain, tenderness along right nuchal line upon deep palpitation    Trigger point injection of right cervical along right nuchal line  1.5 cc of bupivacaine 0.5% was mixed with 1.5 cc of betamethasone,  The injection was placed around right nuchal line, where he has most tenderness, tolerated injection well, reported immediate release of the tension,
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
## 2020-11-18 ENCOUNTER — Other Ambulatory Visit (HOSPITAL_COMMUNITY): Payer: BC Managed Care – PPO

## 2020-11-18 ENCOUNTER — Other Ambulatory Visit: Payer: Self-pay

## 2020-11-19 ENCOUNTER — Other Ambulatory Visit: Payer: Self-pay

## 2020-11-19 ENCOUNTER — Other Ambulatory Visit (HOSPITAL_COMMUNITY): Payer: BC Managed Care – PPO | Admitting: Licensed Clinical Social Worker

## 2020-11-19 DIAGNOSIS — F1994 Other psychoactive substance use, unspecified with psychoactive substance-induced mood disorder: Secondary | ICD-10-CM

## 2020-11-19 DIAGNOSIS — F10280 Alcohol dependence with alcohol-induced anxiety disorder: Secondary | ICD-10-CM | POA: Diagnosis not present

## 2020-11-19 DIAGNOSIS — F1020 Alcohol dependence, uncomplicated: Secondary | ICD-10-CM

## 2020-11-20 NOTE — Progress Notes (Signed)
    Daily Group Progress Note  Program: CD-IOP   Group Time: 1pm-2:30pm Participation Level: Active Behavioral Response: Appropriate Type of Therapy: Process Group Topic: Clinician met with group members, assessing for SI/HI/psychosis and overall level of functioning. Clinician inquired about recent sobriety date and number of 12 step community meetings attended. Clinician and group members processed things going well and not well since last session. Clinician and group members processed any recent triggers or challenges to sobriety including fluctuating levels of motivation, increased intensity of mental health symptoms, and physical health. Clinician validated difficulty with mood sensitivity in early recovery and problems solved use of assertive communication to get needs met. Clinician and group members read and discussed NA Just for Today and AA Daily reflection focused on sharing with and receiving feedback from others in recovery. Clinician provided mindfulness activity for group participation in session.   Group Time: 2:30pm-4pm Participation Level: Active Behavioral Response: Appropriate Type of Therapy: Psycho-education Group Topic: Clinician presented the psycho-educational session topic of boundaries. Clinician and group members reviewed traits of loose, rigid and healthy boundaries and discussed examples of each. Clinician and group provided personal examples of healthy vs unhealthy boundaries and factors effecting setting and maintaining boundaries. Clinician and group members discussed "Common Boundary Myths." Clinician challenged group member thoughts/feelings related to control of personal boundaries vs attempted control of others' responses to boundaries. Clinician and group reflected on the need for placing boundaries with self, not just others. Clinician inquired about self-care activity to be completed prior to next session.   Summary: Client showed progress toward goal of  achieving/maintaining sobriety AEB maintained sobriety date of 09/07/20. Client noted challenges with sleep but showed progress toward goal of distress tolerance skills AEB utilizing emotional release following upseting conversation with girlfriend. Client previously set boundary of no alcohol in the home which has been respected. Client plans to utilize assertive communication skills to set boundary with girlfriend related to communication needs.   Family Program: Family present? No   Name of family member(s): NA  UDS collected: No Results: prescribed medications only, no alcohol or illicit substances  AA/NA attended?: No  Sponsor?: No   Olegario Messier, LCSW

## 2020-11-23 ENCOUNTER — Encounter (HOSPITAL_COMMUNITY): Payer: Self-pay | Admitting: Medical

## 2020-11-23 ENCOUNTER — Other Ambulatory Visit: Payer: Self-pay

## 2020-11-23 ENCOUNTER — Other Ambulatory Visit (HOSPITAL_COMMUNITY): Payer: BC Managed Care – PPO | Admitting: Licensed Clinical Social Worker

## 2020-11-23 DIAGNOSIS — F1021 Alcohol dependence, in remission: Secondary | ICD-10-CM

## 2020-11-23 DIAGNOSIS — F10980 Alcohol use, unspecified with alcohol-induced anxiety disorder: Secondary | ICD-10-CM

## 2020-11-23 DIAGNOSIS — Z811 Family history of alcohol abuse and dependence: Secondary | ICD-10-CM

## 2020-11-23 DIAGNOSIS — Z0282 Encounter for adoption services: Secondary | ICD-10-CM

## 2020-11-23 DIAGNOSIS — R519 Headache, unspecified: Secondary | ICD-10-CM

## 2020-11-23 DIAGNOSIS — F10280 Alcohol dependence with alcohol-induced anxiety disorder: Secondary | ICD-10-CM | POA: Diagnosis not present

## 2020-11-23 DIAGNOSIS — F1994 Other psychoactive substance use, unspecified with psychoactive substance-induced mood disorder: Secondary | ICD-10-CM

## 2020-11-23 DIAGNOSIS — F1910 Other psychoactive substance abuse, uncomplicated: Secondary | ICD-10-CM

## 2020-11-23 DIAGNOSIS — T7412XS Child physical abuse, confirmed, sequela: Secondary | ICD-10-CM

## 2020-11-23 DIAGNOSIS — F341 Dysthymic disorder: Secondary | ICD-10-CM

## 2020-11-23 DIAGNOSIS — F17210 Nicotine dependence, cigarettes, uncomplicated: Secondary | ICD-10-CM

## 2020-11-23 DIAGNOSIS — F4312 Post-traumatic stress disorder, chronic: Secondary | ICD-10-CM
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
## 2020-11-24 NOTE — Progress Notes (Incomplete)
    Daily Group Progress Note  Program: {[facility] AMB BH IOP/CDIOP Program Type:21022744}   Group Time: 1pm-2:30pm Participation Level: Active Behavioral Response: Appropriate and Sharing Type of Therapy: Process Group Topic: Clinician checked in with group members, assessing for SI/HI/psychosis and overall level of functioning including difficulties with cravings or relapse. Clinician and group members processed 'highs and lows' since last group. Clinician facilitated processing discussion on recent challenges in early recovery and encouraged group feedback and problem solving. Clinician assisted group with reframing and validated difficulties in early recovery. Clinician and group members read AA daily reflection and NA Just for today with focus on journaling and fear and its relevance to current place in recovery.     Group Time: 2:30pm-4pm Participation Level: Active Behavioral Response: Appropriate Type of Therapy: Psycho-education Group Topic: Clinician presented the psycho-educational topic of addressing Anger. Clinician and group members discussed anger as a secondary emotion and how/where expression of anger was learned and accepted growing up. Clinician and group members identified physical symptoms of 'small' anger through 'overwhelming' anger. Clinician encouraged group members to be mindful of body sensations to address uncomfortable feelings individually at a less intense level. Clinician and group members started recognizing anger triggers in different settings, and how anger is felt in the body at different intensities. Clinician utilized [facility] Understanding Anger: Tips for Managing Anger worksheet. Clinician presented [employer], focused on healthy communication during disagreements. Clinician inquired about self care activity to support recovery to be completed over the weekend.  Summary: argument with g/f cravings ,parting lot, call friend; plan for opt; attempting to address  furstrations individually but struggl with directing feelings at g/f to meet needs; recommeded alanon for gf   Family Program: Family present? {BHH YES OR NO:22294}   Name of family member(s): ***  UDS collected: {BHH YES OR NO:22294} Results: {Findings; urine drug screen:60936}  AA/NA attended?: {BHH YES OR NO:22294}{DAYS OF ZOXW:96045}  Sponsor?: {BHH YES OR WU:98119}   Harlon Ditty, LCSW

## 2020-11-25 ENCOUNTER — Encounter (HOSPITAL_COMMUNITY): Payer: BC Managed Care – PPO

## 2020-11-26 ENCOUNTER — Encounter (HOSPITAL_COMMUNITY): Payer: BC Managed Care – PPO

## 2020-12-02 ENCOUNTER — Telehealth: Payer: Self-pay | Admitting: *Deleted

## 2020-12-02 NOTE — Telephone Encounter (Signed)
FMLA paperwork received, completed and signed by MD. Returned to medical records to be faxed to his company and to be sent for scanning.

## 2020-12-03 ENCOUNTER — Telehealth: Payer: Self-pay | Admitting: *Deleted

## 2020-12-03 DIAGNOSIS — Z0289 Encounter for other administrative examinations: Secondary | ICD-10-CM

## 2020-12-03 NOTE — Telephone Encounter (Signed)
Pt FMLA form faxed on 12/03/20

## 2020-12-08 ENCOUNTER — Other Ambulatory Visit (HOSPITAL_COMMUNITY): Payer: Self-pay | Admitting: Medical

## 2020-12-18 ENCOUNTER — Other Ambulatory Visit (HOSPITAL_COMMUNITY): Payer: Self-pay | Admitting: Medical

## 2020-12-21 ENCOUNTER — Telehealth (HOSPITAL_COMMUNITY): Payer: Self-pay | Admitting: Licensed Clinical Social Worker

## 2020-12-22 ENCOUNTER — Other Ambulatory Visit (HOSPITAL_COMMUNITY): Payer: BC Managed Care – PPO | Attending: Psychiatry | Admitting: Licensed Clinical Social Worker

## 2020-12-22 ENCOUNTER — Other Ambulatory Visit: Payer: Self-pay

## 2020-12-22 DIAGNOSIS — F1994 Other psychoactive substance use, unspecified with psychoactive substance-induced mood disorder: Secondary | ICD-10-CM

## 2020-12-22 DIAGNOSIS — F1021 Alcohol dependence, in remission: Secondary | ICD-10-CM

## 2020-12-22 DIAGNOSIS — F4312 Post-traumatic stress disorder, chronic: Secondary | ICD-10-CM

## 2020-12-22 NOTE — Progress Notes (Signed)
Virtual Visit via Telephone Note  I connected with Ricky Ford on 12/22/20 at 11:00 AM EDT by telephone and verified that I am speaking with the correct person using two identifiers.  Location: Patient: home Provider: office   I discussed the limitations, risks, security and privacy concerns of performing an evaluation and management service by telephone and the availability of in person appointments. I also discussed with the patient that there may be a patient responsible charge related to this service. The patient expressed understanding and agreed to proceed.  I discussed the assessment and treatment plan with the patient. The patient was provided an opportunity to ask questions and all were answered. The patient agreed with the plan and demonstrated an understanding of the instructions.   The patient was advised to call back or seek an in-person evaluation if the symptoms worsen or if the condition fails to improve as anticipated.  I provided 20 minutes of non-face-to-face time during this encounter.   Harlon Ditty, LCSW    THERAPIST PROGRESS NOTE  Session Time: 11am-11:20am  Participation Level: Active  Behavioral Response: NAAlertDysphoric  Type of Therapy: Individual Therapy  Treatment Goals addressed: Coping and Diagnosis: achieve and maintain sobriety 7/7 days weekly, increase use of healthy coping skills at least 1 time daily to avoid alcohol use  Interventions: Motivational Interviewing and Supportive  Summary: Ricky Ford is a 35 y.o. male who presents with alcohol use disorder, recent lapse, increased depressive symptoms, and lack of transition follow through with outpatient therapy services. Client shared events escalating to recent relapse with alcohol over the weekend. Client acknowledged not attending therapy or AA/NA meetings since discharge from IOP services previous month. Client in agreement to attend medication management appointment on 12/24/20 and  contact outpatient therapist for outpatient therapy. Client previously concerned about the need to re-engage in residential placement however is in agreement with OPT to start.  Suicidal/Homicidal: No  Therapist Response: Clinician checked in with client, assessed for SI/HI/psychosis and overall level of functioning. Clinician processed with client identified stressors over the past month related to relationship, which the relationship ended client identified as the catalyst for relapse. Clinician provided client time to 'think out loud' about process. Clinician encouraged client to re-engage with AA/NA and find someone to contact on days he feels like drinking. Clinician will re-send outpatient therapy resources.  Plan: Return again as needed for referrals; continue with medication management  Diagnosis: Axis I: alcohol use disorder        Harlon Ditty, LCSW 12/22/2020

## 2020-12-23 ENCOUNTER — Other Ambulatory Visit (HOSPITAL_COMMUNITY): Payer: Self-pay | Admitting: Medical

## 2020-12-24 ENCOUNTER — Encounter (HOSPITAL_COMMUNITY): Payer: Self-pay | Admitting: Medical

## 2020-12-24 ENCOUNTER — Other Ambulatory Visit: Payer: Self-pay

## 2020-12-24 ENCOUNTER — Telehealth (INDEPENDENT_AMBULATORY_CARE_PROVIDER_SITE_OTHER): Payer: BC Managed Care – PPO | Admitting: Medical

## 2020-12-24 DIAGNOSIS — F4312 Post-traumatic stress disorder, chronic: Secondary | ICD-10-CM | POA: Diagnosis not present

## 2020-12-24 DIAGNOSIS — Z811 Family history of alcohol abuse and dependence: Secondary | ICD-10-CM

## 2020-12-24 DIAGNOSIS — F1994 Other psychoactive substance use, unspecified with psychoactive substance-induced mood disorder: Secondary | ICD-10-CM | POA: Diagnosis not present

## 2020-12-24 DIAGNOSIS — Z0282 Encounter for adoption services: Secondary | ICD-10-CM | POA: Diagnosis not present

## 2020-12-24 DIAGNOSIS — R519 Headache, unspecified: Secondary | ICD-10-CM

## 2020-12-24 DIAGNOSIS — F10980 Alcohol use, unspecified with alcohol-induced anxiety disorder: Secondary | ICD-10-CM

## 2020-12-24 DIAGNOSIS — F1910 Other psychoactive substance abuse, uncomplicated: Secondary | ICD-10-CM | POA: Diagnosis not present

## 2020-12-24 DIAGNOSIS — T7432XS Child psychological abuse, confirmed, sequela: Secondary | ICD-10-CM

## 2020-12-24 DIAGNOSIS — F17210 Nicotine dependence, cigarettes, uncomplicated: Secondary | ICD-10-CM

## 2020-12-24 DIAGNOSIS — F341 Dysthymic disorder: Secondary | ICD-10-CM

## 2020-12-24 DIAGNOSIS — F1021 Alcohol dependence, in remission: Secondary | ICD-10-CM

## 2020-12-24 MED ORDER — MIRTAZAPINE 30 MG PO TABS: 30.0000 mg | ORAL_TABLET | Freq: Every day | ORAL | 0 refills | Status: DC

## 2020-12-24 MED ORDER — BACLOFEN 10 MG PO TABS: 10.0000 mg | ORAL_TABLET | Freq: Three times a day (TID) | ORAL | 0 refills | Status: AC

## 2020-12-24 MED ORDER — BUSPIRONE HCL 10 MG PO TABS: 10.0000 mg | ORAL_TABLET | Freq: Three times a day (TID) | ORAL | 2 refills | Status: AC
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
## 2021-01-14 ENCOUNTER — Other Ambulatory Visit (HOSPITAL_COMMUNITY): Payer: Self-pay | Admitting: Medical

## 2021-01-21 NOTE — Telephone Encounter (Signed)
Pt needs to get Rx from PCP

## 2021-01-24 ENCOUNTER — Other Ambulatory Visit (HOSPITAL_COMMUNITY): Payer: Self-pay | Admitting: Medical

## 2021-02-11 NOTE — Telephone Encounter (Signed)
[facility] IOP discharged to FU with PCP

## 2021-03-11 ENCOUNTER — Ambulatory Visit (INDEPENDENT_AMBULATORY_CARE_PROVIDER_SITE_OTHER): Payer: BC Managed Care – PPO | Admitting: Neurology

## 2021-03-11 ENCOUNTER — Encounter: Payer: Self-pay | Admitting: Neurology

## 2021-03-11 VITALS — BP 138/82 | HR 87 | Ht 71.0 in | Wt 160.5 lb

## 2021-03-11 DIAGNOSIS — F32A Depression, unspecified: Secondary | ICD-10-CM

## 2021-03-11 DIAGNOSIS — G43709 Chronic migraine without aura, not intractable, without status migrainosus: Secondary | ICD-10-CM | POA: Diagnosis not present

## 2021-03-11 MED ORDER — LAMOTRIGINE 100 MG PO TABS
100.0000 mg | ORAL_TABLET | Freq: Two times a day (BID) | ORAL | 11 refills | Status: DC
Start: 2021-03-11 — End: 2022-02-15

## 2021-03-11 MED ORDER — RIZATRIPTAN BENZOATE 10 MG PO TBDP: 10.0000 mg | ORAL_TABLET | ORAL | 11 refills | Status: AC | PRN

## 2021-03-11 MED ORDER — LAMOTRIGINE 25 MG PO TABS: ORAL_TABLET | ORAL | 0 refills | Status: DC
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
## 2021-03-18 ENCOUNTER — Other Ambulatory Visit (HOSPITAL_COMMUNITY): Payer: Self-pay | Admitting: Medical

## 2021-03-25 NOTE — Telephone Encounter (Signed)
Needs appointment if he is not getting from PCP

## 2021-03-30 ENCOUNTER — Other Ambulatory Visit: Payer: Self-pay | Admitting: Neurology

## 2021-06-19 ENCOUNTER — Other Ambulatory Visit (HOSPITAL_COMMUNITY): Payer: Self-pay | Admitting: Medical

## 2021-06-23 NOTE — Telephone Encounter (Signed)
Not my patient May schedule visit

## 2021-08-02 ENCOUNTER — Ambulatory Visit: Payer: BC Managed Care – PPO | Admitting: Neurology

## 2021-08-19 ENCOUNTER — Encounter: Payer: Self-pay | Admitting: Adult Health

## 2021-08-19 ENCOUNTER — Ambulatory Visit (INDEPENDENT_AMBULATORY_CARE_PROVIDER_SITE_OTHER): Payer: BC Managed Care – PPO | Admitting: Adult Health

## 2021-08-19 VITALS — BP 120/77 | HR 69 | Ht 71.0 in | Wt 187.8 lb

## 2021-08-19 DIAGNOSIS — G43709 Chronic migraine without aura, not intractable, without status migrainosus: Secondary | ICD-10-CM | POA: Diagnosis not present

## 2021-08-19 MED ORDER — TIZANIDINE HCL 4 MG PO TABS: 4.0000 mg | ORAL_TABLET | Freq: Three times a day (TID) | ORAL | 5 refills | Status: AC | PRN
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
## 2021-08-19 NOTE — Patient Instructions (Addendum)
Your Plan:  Continue Lamcital 100 mg twice a day Continue maxalt and tizanidine for abortive therapy Continue propranolol  If your symptoms worsen or you develop new symptoms please let us know.   Thank you for coming to see [place] at [facility]. I hope we have been able to provide you high quality care today.  You may receive a patient satisfaction survey over the next few weeks. We would appreciate your feedback and comments so that we may continue to improve ourselves and the health of our patients.

## 2021-12-04 ENCOUNTER — Other Ambulatory Visit (HOSPITAL_COMMUNITY)
Admission: EM | Admit: 2021-12-04 | Discharge: 2021-12-04 | Disposition: A | Discharge status: Home / Self Care | Payer: BC Managed Care – PPO | Attending: Psychiatry | Admitting: Psychiatry

## 2021-12-04 DIAGNOSIS — F331 Major depressive disorder, recurrent, moderate: Secondary | ICD-10-CM | POA: Diagnosis not present

## 2021-12-04 DIAGNOSIS — F329 Major depressive disorder, single episode, unspecified: Secondary | ICD-10-CM | POA: Diagnosis present

## 2021-12-04 DIAGNOSIS — Z789 Other specified health status: Secondary | ICD-10-CM

## 2021-12-04 DIAGNOSIS — F411 Generalized anxiety disorder: Secondary | ICD-10-CM

## 2021-12-04 MED ORDER — ACETAMINOPHEN 325 MG PO TABS: 650.0000 mg | ORAL_TABLET | Freq: Four times a day (QID) | ORAL | Status: DC | PRN

## 2021-12-04 MED ORDER — TRAZODONE HCL 50 MG PO TABS: 50.0000 mg | ORAL_TABLET | Freq: Every evening | ORAL | Status: DC | PRN

## 2021-12-04 MED ORDER — HYDROXYZINE HCL 25 MG PO TABS: 25.0000 mg | ORAL_TABLET | Freq: Three times a day (TID) | ORAL | Status: DC | PRN

## 2021-12-04 MED ORDER — MAGNESIUM HYDROXIDE 400 MG/5ML PO SUSP: 30.0000 mL | Freq: Every day | ORAL | Status: DC | PRN

## 2021-12-04 MED ORDER — ALUM & MAG HYDROXIDE-SIMETH 200-200-20 MG/5ML PO SUSP: 30.0000 mL | ORAL | Status: DC | PRN

## 2021-12-04 NOTE — Progress Notes (Signed)
   12/04/21 2024  [place] Triage Screening (Walk-ins at [facility] only)  How Did You Hear About [place]? Family/Friend  What Is the Reason for Your Visit/Call Today? Pt presents voluntary to [facility] unaccompanied. Pt reports that he is very disappointed in himself because he had 5  months clean and sober. Pt states that last night he engaged in drinking alcohol which is his drug of choice [facility]), and he used cocaine. Pt reports that he typically does not use cocaine, however, shares that he had friends over to him and his girlfriends place last night. Pt states that he and his girlfriend met in rehab and advised that his girlfriend's father is the one person that drove him to [facility] to seek help. Pt shares that his relationship with his girlfriend is the best relationship he has ever had, and he plans on having a future with her. Pt reports that his girlfriend is not supportive of him drinking or uses substances. Pt states that his girlfriend drinks sometimes but advises that drinking was not her DOC. Pt denies SI/ HI/ AVH/ Paranoia. Pt admits to depression and anxiety. Pt shares that he takes medication for bipolar but states that he has not been dx with bipolar. Pt states that he needs help and reports that his girlfriend has a therapist to talk to every week. Pt states that he needs his own therapist too.  CSW staffed that case with provider E. Nwoko, PA and pt is routine to follow up with outpatient therapy and medication management.  How Long Has This Been Causing You Problems? 1 wk - 1 month  Have You Recently Had Any Thoughts About Hurting Yourself? No  Are You Planning to Commit Suicide/Harm Yourself At This time? No  Have you Recently Had Thoughts About [place]? No  Are You Planning To Harm Someone At This Time? No  Are you currently experiencing any auditory, visual or other hallucinations? No  Have You Used Any Alcohol or Drugs in the Past 24 Hours? Yes  How long ago did you use Drugs or Alcohol?  "Yesterday, A few drinks and two lines of cocaine."  What Did You Use and How Much? A few drinks and two lines of cocaine.  Do you have any current medical co-morbidities that require immediate attention? No  Clinician description of patient physical appearance/behavior: Pt dressed appropriate for age. Pt appears anxious as evident by pt shaking his leg. Pt made good eye contact and very open to verbally sharing his thoughts.  What Do You Feel Would Help You the Most Today? Alcohol or Drug Use Treatment;Treatment for Depression or other mood problem  If access to [facility] was not available, would you have sought care in the Emergency Department? Yes  Determination of Need Routine (7 days)  Options For Referral Chemical Dependency Intensive Outpatient Therapy (CDIOP);Mobile Crisis;Outpatient Therapy   Benjaman Kindler, MSW, [facility] 12/04/2021 10:23 PM

## 2021-12-04 NOTE — Discharge Instructions (Addendum)
Patient to be given outpatient resources for therapy and psychiatry.

## 2021-12-04 NOTE — ED Provider Notes (Signed)
Behavioral Health Urgent Care Medical Screening Exam  Patient Name: Ricky Ford MRN: [id number] Date of Evaluation: 12/04/21 Chief Complaint:   Diagnosis:  Final diagnoses:  Moderate episode of recurrent major depressive disorder ([place])  Anxiety state  Alcohol use    History of Present illness: Ricky Ford is a 36 y.o. male is a 36 year old male with a past psychiatric history significant for depression and alcohol use who presents to [facility] with a chief complaint of issues with alcohol use.  Patient reports that he has gone to rehab twice last year for alcohol abuse.  Patient reports that he was maintaining his sobriety for 5-1/2 months but now states that "everything has gone to shit."  Due to his current use of alcohol, patient states that he is fearful of losing his job and his insurance.  Patient believes that he needs rehab but is concerned that if he does rehab, he will be unable to afford his rent.  Patient states that he has been using mostly alcohol but does admit to using cocaine last night.  Patient reports that he took 2 hits of cocaine with a group of friends.  Patient reports that he has really upsets that he used and is deeply disappointed with himself.  Patient denies a past history of struggling with cocaine and states that he is mostly disappointed about breaking his sobriety.  Patient endorses depression and endorses the following symptoms: low mood, lack of motivation, decreased concentration, decreased energy, feelings of guilt/worthlessness, hopelessness, and crying spells.  Patient also endorses anxiety and rates his anxiety an 8 out of 10.  Patient is unsure of the triggers to his anxiety but endorses the following stressors: work, money, and issues within his relationship.  Patient is currently living with his girlfriend, whom he met in rehab.  He reports that it has been the best relationship he has had.  He endorses some  interpersonal issues between him and his girlfriend related to his use of alcohol.  He states that his girlfriend rarely consumes alcohol.  Patient denies having a therapist or a psychiatrist.  Patient is currently taking the following medications: Propranolol, lamotrigine, buspirone.  Patient endorses a past history of suicide via drug overdose that occurred 3 to 4 years ago.  Patient denies a past history of self-harm.  Patient is alert and oriented x4, calm, cooperative, and fully engaged in conversation during the encounter.  Patient denies suicidal ideations but states that he occasionally has a thought every now and then regarding if he would be better off if he was dead.  Patient denies homicidal ideations.  He further denies auditory or visual hallucinations and does not appear to be responding to internal/external stimuli.  Patient endorses fair sleep and receives on average 6 to 7 hours of sleep each night.  Patient endorses fair appetite and eats on average 2 meals per day.  Patient states that he relapsed on alcohol a couple weeks ago and it continues to weigh heavily on his mind.  Patient denies tobacco use but states that he does engage in [place].  Patient endorses illicit drug use in the form of cocaine.  Patient states that he does not normally use cocaine and describes last night as spur of the moment.  Patient endorses having social support.  Patient denies access to weapons in the home.  Patient denies being a danger to himself and is able to contract for safety.  Patient is interested in receiving resources for therapy/counseling.  Psychiatric Specialty Exam  Presentation  General Appearance:Appropriate for Environment; Well Groomed  Eye Contact:Good  Speech:Clear and Coherent; Normal Rate  Speech Volume:Normal  Handedness:Right   Mood and Affect  Mood:Depressed; Anxious  Affect:Congruent; Depressed   Thought Process  Thought Processes:Coherent; Goal Directed;  Linear  Descriptions of Associations:Intact  Orientation:Full (Time, Place and Person)  Thought Content:Logical; WDL  Diagnosis of Schizophrenia or Schizoaffective disorder in past: No data recorded  Hallucinations:None  Ideas of Reference:None  Suicidal Thoughts:No  Homicidal Thoughts:No   Sensorium  Memory:Immediate Good; Recent Good; Remote Good  Judgment:Good  Insight:Fair   Executive Functions  Concentration:Good  Attention Span:Good  [place]  Language:Good   Psychomotor Activity  Psychomotor Activity:Normal   Assets  Assets:Communication Skills; Desire for Improvement; Housing; Catering manager; Intimacy; Social Support; Vocational/Educational   Sleep  Sleep:Fair  Number of hours: 7   No data recorded  Physical Exam: Physical Exam Psychiatric:        Attention and Perception: Attention normal. He does not perceive auditory or visual hallucinations.        Mood and Affect: Affect normal. Mood is anxious and depressed.        Behavior: Behavior normal. Behavior is not agitated or aggressive. Behavior is cooperative.        Thought Content: Thought content normal. Thought content does not include homicidal or suicidal ideation. Thought content does not include suicidal plan.        Cognition and Memory: Cognition and memory normal.        Judgment: Judgment normal.   Review of Systems  Psychiatric/Behavioral:  Positive for depression and substance abuse. Negative for hallucinations and suicidal ideas. The patient is nervous/anxious. The patient does not have insomnia.   Blood pressure 137/90, pulse 88, temperature 98.4 F (36.9 C), temperature source Oral, resp. rate 18. There is no height or weight on file to calculate BMI.  Musculoskeletal: Strength & Muscle Tone: within normal limits Gait & Station: normal Patient leans: N/A   [place] MSE Discharge Disposition for Follow up and Recommendations: Based  on my evaluation the patient does not appear to have an emergency medical condition and can be discharged with resources and follow up care in outpatient services for Medication Management, Substance Abuse Intensive Outpatient Program, and Individual Therapy.  Due to patient denying suicidal ideations, homicidal ideations, and further denying auditory/visual hallucinations, patient does not meet criteria for continuous observation or inpatient psychiatry.  Patient to be provided resources for individual therapy and substance abuse programs.  Malachy Mood, PA 12/04/2021, 10:16 PM

## 2021-12-14 ENCOUNTER — Other Ambulatory Visit: Payer: Self-pay | Admitting: *Deleted

## 2021-12-14 ENCOUNTER — Ambulatory Visit
Admission: RE | Admit: 2021-12-14 | Discharge: 2021-12-14 | Disposition: A | Discharge status: Home / Self Care | Payer: No Typology Code available for payment source | Source: Ambulatory Visit | Attending: *Deleted | Admitting: *Deleted

## 2021-12-14 DIAGNOSIS — R7611 Nonspecific reaction to tuberculin skin test without active tuberculosis: Secondary | ICD-10-CM

## 2022-02-08 ENCOUNTER — Emergency Department (HOSPITAL_COMMUNITY): Payer: BC Managed Care – PPO

## 2022-02-08 ENCOUNTER — Ambulatory Visit (HOSPITAL_COMMUNITY)
Admission: AD | Admit: 2022-02-08 | Discharge: 2022-02-08 | Disposition: A | Discharge status: Home / Self Care | Payer: BC Managed Care – PPO | Attending: Psychiatry | Admitting: Psychiatry

## 2022-02-08 ENCOUNTER — Encounter (HOSPITAL_COMMUNITY): Payer: Self-pay

## 2022-02-08 ENCOUNTER — Other Ambulatory Visit: Payer: Self-pay

## 2022-02-08 ENCOUNTER — Observation Stay (HOSPITAL_COMMUNITY)
Admission: EM | Admit: 2022-02-08 | Discharge: 2022-02-10 | Disposition: A | Discharge status: Home / Self Care | Payer: BC Managed Care – PPO | Attending: Internal Medicine | Admitting: Internal Medicine

## 2022-02-08 DIAGNOSIS — E872 Acidosis, unspecified: Secondary | ICD-10-CM | POA: Diagnosis not present

## 2022-02-08 DIAGNOSIS — F32A Depression, unspecified: Secondary | ICD-10-CM

## 2022-02-08 DIAGNOSIS — F10932 Alcohol use, unspecified with withdrawal with perceptual disturbance: Secondary | ICD-10-CM

## 2022-02-08 DIAGNOSIS — F10132 Alcohol abuse with withdrawal with perceptual disturbance: Secondary | ICD-10-CM | POA: Diagnosis not present

## 2022-02-08 DIAGNOSIS — Z79899 Other long term (current) drug therapy: Secondary | ICD-10-CM | POA: Insufficient documentation

## 2022-02-08 DIAGNOSIS — F329 Major depressive disorder, single episode, unspecified: Secondary | ICD-10-CM | POA: Diagnosis present

## 2022-02-08 DIAGNOSIS — Z20822 Contact with and (suspected) exposure to covid-19: Secondary | ICD-10-CM | POA: Insufficient documentation

## 2022-02-08 DIAGNOSIS — R112 Nausea with vomiting, unspecified: Secondary | ICD-10-CM | POA: Diagnosis present

## 2022-02-08 DIAGNOSIS — F17210 Nicotine dependence, cigarettes, uncomplicated: Secondary | ICD-10-CM | POA: Diagnosis not present

## 2022-02-08 DIAGNOSIS — G43709 Chronic migraine without aura, not intractable, without status migrainosus: Secondary | ICD-10-CM | POA: Diagnosis present

## 2022-02-08 DIAGNOSIS — D72829 Elevated white blood cell count, unspecified: Secondary | ICD-10-CM | POA: Insufficient documentation

## 2022-02-08 DIAGNOSIS — F10939 Alcohol use, unspecified with withdrawal, unspecified: Secondary | ICD-10-CM

## 2022-02-08 DIAGNOSIS — R079 Chest pain, unspecified: Secondary | ICD-10-CM | POA: Insufficient documentation

## 2022-02-08 DIAGNOSIS — F1990 Other psychoactive substance use, unspecified, uncomplicated: Secondary | ICD-10-CM | POA: Diagnosis present

## 2022-02-08 LAB — ACETAMINOPHEN LEVEL: Acetaminophen (Tylenol), Serum: <10 ug/mL — ABNORMAL LOW (ref 10–30)

## 2022-02-08 LAB — CBC
HCT: 50.4 % (ref 39.0–52.0)
Hemoglobin: 17 g/dL (ref 13.0–17.0)
MCH: 31.4 pg (ref 26.0–34.0)
MCHC: 33.7 g/dL (ref 30.0–36.0)
MCV: 93.2 fL (ref 80.0–100.0)
Platelets: 386 10*3/uL (ref 150–400)
RBC: 5.41 MIL/uL (ref 4.22–5.81)
RDW: 12.9 % (ref 11.5–15.5)
WBC: 16.1 10*3/uL — ABNORMAL HIGH (ref 4.0–10.5)
nRBC: 0 % (ref 0.0–0.2)

## 2022-02-08 LAB — COMPREHENSIVE METABOLIC PANEL
ALT: 22 U/L (ref 0–44)
AST: 23 U/L (ref 15–41)
Albumin: 5 g/dL (ref 3.5–5.0)
Alkaline Phosphatase: 89 U/L (ref 38–126)
Anion gap: >20 — ABNORMAL HIGH (ref 5–15)
BUN: 16 mg/dL (ref 6–20)
CO2: 16 mmol/L — ABNORMAL LOW (ref 22–32)
Calcium: 9.4 mg/dL (ref 8.9–10.3)
Chloride: 102 mmol/L (ref 98–111)
Creatinine, Ser: 0.89 mg/dL (ref 0.61–1.24)
GFR, Estimated: >60 mL/min (ref >60)
Glucose, Bld: 116 mg/dL — ABNORMAL HIGH (ref 70–99)
Potassium: 4 mmol/L (ref 3.5–5.1)
Sodium: 142 mmol/L (ref 135–145)
Total Bilirubin: 0.6 mg/dL (ref 0.3–1.2)
Total Protein: 9.2 g/dL — ABNORMAL HIGH (ref 6.5–8.1)

## 2022-02-08 LAB — URINALYSIS, ROUTINE W REFLEX MICROSCOPIC
Bacteria, UA: NONE SEEN
Bilirubin Urine: NEGATIVE
Glucose, UA: NEGATIVE mg/dL
Hgb urine dipstick: NEGATIVE
Ketones, ur: 20 mg/dL — AB
Leukocytes,Ua: NEGATIVE
Nitrite: NEGATIVE
Protein, ur: >=300 mg/dL — AB
Specific Gravity, Urine: 1.024 (ref 1.005–1.030)
pH: 5 (ref 5.0–8.0)

## 2022-02-08 LAB — RAPID URINE DRUG SCREEN, HOSP PERFORMED
Amphetamines: NOT DETECTED
Barbiturates: NOT DETECTED
Benzodiazepines: NOT DETECTED
Cocaine: NOT DETECTED
Opiates: NOT DETECTED
Tetrahydrocannabinol: POSITIVE — AB

## 2022-02-08 LAB — ETHANOL: Alcohol, Ethyl (B): 206 mg/dL — ABNORMAL HIGH (ref <10)

## 2022-02-08 LAB — BLOOD GAS, VENOUS
Acid-base deficit: 7.1 mmol/L — ABNORMAL HIGH (ref 0.0–2.0)
Bicarbonate: 15.8 mmol/L — ABNORMAL LOW (ref 20.0–28.0)
O2 Saturation: 90.9 %
Patient temperature: 37
pCO2, Ven: 25 mmHg — ABNORMAL LOW (ref 44–60)
pH, Ven: 7.41 (ref 7.25–7.43)
pO2, Ven: 62 mmHg — ABNORMAL HIGH (ref 32–45)

## 2022-02-08 LAB — SALICYLATE LEVEL: Salicylate Lvl: <7 mg/dL — ABNORMAL LOW (ref 7.0–30.0)

## 2022-02-08 LAB — D-DIMER, QUANTITATIVE: D-Dimer, Quant: <0.27 ug/mL-FEU (ref 0.00–0.50)

## 2022-02-08 LAB — LACTIC ACID, PLASMA: Lactic Acid, Venous: 6.7 mmol/L (ref 0.5–1.9)

## 2022-02-08 LAB — TROPONIN I (HIGH SENSITIVITY)
Troponin I (High Sensitivity): 3 ng/L (ref <18)
Troponin I (High Sensitivity): 6 ng/L (ref <18)

## 2022-02-08 MED ORDER — THIAMINE HCL 100 MG/ML IJ SOLN: 100.0000 mg | Freq: Every day | INTRAMUSCULAR | Status: DC

## 2022-02-08 MED ORDER — ENOXAPARIN SODIUM 40 MG/0.4ML IJ SOSY
40.0000 mg | PREFILLED_SYRINGE | INTRAMUSCULAR | Status: DC
Start: 2022-02-09 — End: 2022-02-10
  Administered 2022-02-09 – 2022-02-10 (×2): 40 mg via SUBCUTANEOUS
  Filled 2022-02-08 (×2): qty 0.4

## 2022-02-08 MED ORDER — PROPRANOLOL HCL 20 MG PO TABS
60.0000 mg | ORAL_TABLET | Freq: Once | ORAL | Status: AC
  Administered 2022-02-08: 60 mg via ORAL
  Filled 2022-02-08: qty 3

## 2022-02-08 MED ORDER — SENNOSIDES-DOCUSATE SODIUM 8.6-50 MG PO TABS: 1.0000 | ORAL_TABLET | Freq: Every evening | ORAL | Status: DC | PRN

## 2022-02-08 MED ORDER — CHLORDIAZEPOXIDE HCL 5 MG PO CAPS: 25.0000 mg | ORAL_CAPSULE | ORAL | Status: DC

## 2022-02-08 MED ORDER — ACETAMINOPHEN 325 MG PO TABS
650.0000 mg | ORAL_TABLET | Freq: Four times a day (QID) | ORAL | Status: DC | PRN
  Administered 2022-02-09: 650 mg via ORAL
  Filled 2022-02-08: qty 2

## 2022-02-08 MED ORDER — ADULT MULTIVITAMIN W/MINERALS CH
1.0000 | ORAL_TABLET | Freq: Every day | ORAL | Status: DC
  Administered 2022-02-09 – 2022-02-10 (×2): 1 via ORAL
  Filled 2022-02-08 (×2): qty 1

## 2022-02-08 MED ORDER — THIAMINE HCL 100 MG PO TABS
100.0000 mg | ORAL_TABLET | Freq: Every day | ORAL | Status: DC
  Administered 2022-02-09 – 2022-02-10 (×2): 100 mg via ORAL
  Filled 2022-02-08 (×2): qty 1

## 2022-02-08 MED ORDER — SODIUM CHLORIDE 0.9 % IV BOLUS
1000.0000 mL | Freq: Once | INTRAVENOUS | Status: AC
  Administered 2022-02-08: 1000 mL via INTRAVENOUS

## 2022-02-08 MED ORDER — SODIUM CHLORIDE 0.9 % IV SOLN
INTRAVENOUS | Status: DC
Start: 2022-02-09 — End: 2022-02-09

## 2022-02-08 MED ORDER — CHLORDIAZEPOXIDE HCL 5 MG PO CAPS
25.0000 mg | ORAL_CAPSULE | Freq: Four times a day (QID) | ORAL | Status: AC
  Administered 2022-02-09 – 2022-02-10 (×6): 25 mg via ORAL
  Filled 2022-02-08: qty 1
  Filled 2022-02-08 (×2): qty 5
  Filled 2022-02-08: qty 1
  Filled 2022-02-08 (×2): qty 5

## 2022-02-08 MED ORDER — LORAZEPAM 1 MG PO TABS
2.0000 mg | ORAL_TABLET | Freq: Once | ORAL | Status: AC
Start: 2022-02-08 — End: 2022-02-08
  Administered 2022-02-08: 2 mg via ORAL
  Filled 2022-02-08: qty 2

## 2022-02-08 MED ORDER — ONDANSETRON HCL 4 MG PO TABS: 4.0000 mg | ORAL_TABLET | Freq: Four times a day (QID) | ORAL | Status: DC | PRN

## 2022-02-08 MED ORDER — LORAZEPAM 2 MG/ML IJ SOLN: 1.0000 mg | INTRAMUSCULAR | Status: DC | PRN

## 2022-02-08 MED ORDER — ONDANSETRON HCL 4 MG/2ML IJ SOLN: 4.0000 mg | Freq: Four times a day (QID) | INTRAMUSCULAR | Status: DC | PRN

## 2022-02-08 MED ORDER — ACETAMINOPHEN 650 MG RE SUPP: 650.0000 mg | Freq: Four times a day (QID) | RECTAL | Status: DC | PRN

## 2022-02-08 MED ORDER — LOPERAMIDE HCL 2 MG PO CAPS: 2.0000 mg | ORAL_CAPSULE | ORAL | Status: DC | PRN

## 2022-02-08 MED ORDER — SODIUM CHLORIDE 0.9% FLUSH
3.0000 mL | Freq: Two times a day (BID) | INTRAVENOUS | Status: DC
  Administered 2022-02-09 – 2022-02-10 (×3): 3 mL via INTRAVENOUS

## 2022-02-08 MED ORDER — CHLORDIAZEPOXIDE HCL 5 MG PO CAPS: 25.0000 mg | ORAL_CAPSULE | Freq: Every day | ORAL | Status: DC

## 2022-02-08 MED ORDER — FOLIC ACID 1 MG PO TABS
1.0000 mg | ORAL_TABLET | Freq: Every day | ORAL | Status: DC
Start: 2022-02-09 — End: 2022-02-10
  Administered 2022-02-09 – 2022-02-10 (×2): 1 mg via ORAL
  Filled 2022-02-08 (×2): qty 1

## 2022-02-08 MED ORDER — LORAZEPAM 2 MG/ML IJ SOLN
2.0000 mg | Freq: Once | INTRAMUSCULAR | Status: AC
  Administered 2022-02-08: 2 mg via INTRAVENOUS
  Filled 2022-02-08: qty 1

## 2022-02-08 MED ORDER — CHLORDIAZEPOXIDE HCL 5 MG PO CAPS: 25.0000 mg | ORAL_CAPSULE | Freq: Three times a day (TID) | ORAL | Status: DC

## 2022-02-08 MED ORDER — LORAZEPAM 1 MG PO TABS: 1.0000 mg | ORAL_TABLET | ORAL | Status: DC | PRN

## 2022-02-08 NOTE — ED Notes (Signed)
Repeat EKG was done when pts HR was at 150, given to [facility] MD

## 2022-02-08 NOTE — H&P (Signed)
Behavioral Health Medical Screening Exam  Ricky Ford is a 36 y.o. Caucasian male who presents voluntarily to [facility] accompanied by his adopted mom and his girlfriend for worsening drinking alcohol problems in drunk states, use of crack cocaine with depression and anxiety.  Patient lives in [place] with his girlfriend who encouraged him to come to [facility]  Patient reports he has been drinking for the past 5 years and occasionally using crack cocaine. Reports he has been to rehab for his drinking problem in [place] in 2022 for 45 days; and also he received rehabilitation at [facility] in [place] in 2023 for 22 days. Denies suicidal ideation, homicidal ideation, paranoia, delusion, or visual hallucinations. Denies history of suicidal attempt in the past or currently. Denies family history of mental illness.  Patient added I am adopted and not sure about my biological family. Endorses auditory hallucinations while in the state of drunkenness.  Hearing voices telling him that he is a piece of shit and you not good human being. Endorses drinking alcohol of vodka 1 pint daily starting at age 30 years old and occasional crack cocaine use, with stressors being "life."  Patient instructed on cessation of  polysubstance use due to adverse consequences on the body system and his thinking process. Endorses anxiety and rates level at 7/10, on a scale of 0-10 with 10 being the worst. Endorses depression symptoms and characterized as guilt, irritability/anger, worthlessness, tearfulness, fatigue, self-isolation, and being unable to get out of bed. Denies access to a weapon, denies criminal charges, pending charges, upcoming court dates of probation status.  On assessment today, patient is seen and examined in the screen room.  Skin: Very pink and sweaty with complaint of chest pain, sweating and slurred speech.  Chart reviewed and findings  shared with the treatment team and consulted with Dr. Lucianne Muss.  Alert and oriented x 4 to person, time, place, and situation.  Speech clear with normal rate and volume.  Affect congruent and depressed with anxious mood.  Thought process coherent and linear and thought content logical.  Observed patient to be belching and vomited vomiting x 1, with temperature of 99 degrees, blood pressure of 163/108 and pulse rate of 132.  Disposition: Based on my evaluation, patient is sent to  [facility] long emergency room hospital for medical clearance.  [facility] long ED charge nurse called and report provided.   Safe transport called and transferred patient to [facility] long ED.  Departed [facility] without further incidents.   Total Time spent with patient: 1 hour  Psychiatric Specialty Exam:  Presentation  General Appearance: Appropriate for Environment; Casual; Fairly Groomed  Eye Contact:Good  Speech:Clear and Coherent; Normal Rate  Speech Volume:Normal  Handedness:Right  Mood and Affect  Mood:Anxious; Depressed  Affect:Congruent; Depressed  Thought Process  Thought Processes:Coherent; Linear  Descriptions of Associations:Intact  Orientation:Full (Time, Place and Person)  Thought Content:Logical  History of Schizophrenia/Schizoaffective disorder:No data recorded Duration of Psychotic Symptoms:No data recorded Hallucinations:Hallucinations: Auditory Description of Auditory Hallucinations: "you are worthless and a piece of Shit."  Ideas of Reference:None  Suicidal Thoughts:Suicidal Thoughts: No  Homicidal Thoughts:Homicidal Thoughts: No  Sensorium  Memory:Immediate Fair; Remote Fair; Recent Fair  Judgment:Fair  Insight:Fair  Executive Functions  Concentration:Fair  Attention Span:Fair  Recall:Fair  Fund of Knowledge:Fair  Language:Good  Psychomotor Activity  Psychomotor Activity:Psychomotor Activity: Normal  Assets  Assets:Communication Skills; Physical Health; Social  Support  Sleep  Sleep:Sleep: Poor Number of Hours of Sleep: 2  Physical Exam: Physical Exam Vitals and nursing note reviewed.  Constitutional:      Appearance: Normal appearance.  HENT:     Head: Normocephalic and atraumatic.     Right Ear: External ear normal.     Left Ear: External ear normal.     Mouth/Throat:     Mouth: Mucous membranes are moist.     Pharynx: Oropharynx is clear.  Eyes:     Extraocular Movements: Extraocular movements intact.     Conjunctiva/sclera: Conjunctivae normal.     Pupils: Pupils are equal, round, and reactive to light.  Cardiovascular:     Rate and Rhythm: Tachycardia present.     Comments: BP 163/108, P 132 Pulmonary:     Effort: Pulmonary effort is normal.  Abdominal:     Palpations: Abdomen is soft.     Comments: N/V  Genitourinary:    Comments: deferred Musculoskeletal:        General: Normal range of motion.     Cervical back: Normal range of motion.  Skin:    General: Skin is warm.  Neurological:     General: No focal deficit present.     Mental Status: He is alert and oriented to person, place, and time.  Psychiatric:        Behavior: Behavior normal.    Review of Systems  Constitutional: Negative.  Negative for chills and fever.  HENT: Negative.  Negative for hearing loss and tinnitus.   Eyes: Negative.  Negative for blurred vision and double vision.  Respiratory: Negative.  Negative for cough, sputum production and shortness of breath.   Cardiovascular: Negative.  Negative for chest pain and palpitations.       BP 163/108, P 132. C/O CP and sweating   Gastrointestinal:  Positive for nausea (From intoxication) and vomiting (From intoxication). Negative for heartburn.  Genitourinary: Negative.  Negative for dysuria, frequency and urgency.  Musculoskeletal: Negative.  Negative for myalgias and neck pain.  Skin: Negative.  Negative for itching and rash.       Facial skin bright red  Neurological: Negative.  Negative for  dizziness, tingling, tremors and headaches.  Endo/Heme/Allergies: Negative.  Negative for environmental allergies and polydipsia. Does not bruise/bleed easily.       Pollen Extract Pollen Extract  Itching Not Specified  01/16/2019 Deletion Reason:     Psychiatric/Behavioral:  Positive for depression, hallucinations and substance abuse. The patient is nervous/anxious and has insomnia.    There were no vitals taken for this visit. There is no height or weight on file to calculate BMI.  Musculoskeletal: Strength & Muscle Tone: within normal limits Gait & Station: normal Patient leans: N/A  [place] Scale:  Flowsheet Row OP [facility] from 11/12/2020 in [facility] Counselor from 09/29/2020 in [facility] [place]  C-SSRS RISK CATEGORY No Risk No Risk No Risk       Recommendations:  Based on my evaluation the patient appears to have an emergency medical condition for which I recommend the patient be transferred to the emergency department for further evaluation.  Cecilie Lowers, FNP 02/08/2022, 4:21 PM
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
## 2022-02-08 NOTE — ED Triage Notes (Addendum)
Pt was referred to ED by [facility] for med clearance. Pt c/o chest pain, nausea, and vomiting for the past 2 hours. Pt states he smoked crack cocaine yesterday. Pt tachycardic during triage
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
## 2022-02-08 NOTE — ED Provider Triage Note (Incomplete)
Emergency Medicine Provider Triage Evaluation Note  Ricky Ford , a 36 y.o. male  was evaluated in triage.  Pt complains of left-sided chest pain, shortness of breath, palpitations.  Seen by behavioral health prior to arrival for alcohol withdrawal.  Patient with headache, nausea, tremors, paresthesias.  Cannot PERC due to significant tachycardia chest pain plan on labs.  Has been to rehab 4 times for alcohol withdrawal.  He denies any prior history of DTs or withdrawal seizures.  Review of Systems  Positive: CP, Etoh wd Negative:   Physical Exam  BP (!) 156/107   Pulse (!) 129   Temp 99.1 F (37.3 C) (Oral)   Resp 18   Ht 5\' 11"  (1.803 m)   Wt 90.7 kg   SpO2 93%   BMI 27.89 kg/m  Gen:   Awake, visibly anxious Resp:  Normal effort  Cardiac: Tachycardic MSK:   Moves extremities without difficulty, tremulous bilateral upper extremities Other:    Medical Decision Making  Medically screening exam initiated at 5:46 PM.  Appropriate orders placed.  Ricky Ford was informed that the remainder of the evaluation will be completed by another provider, this initial triage assessment does not replace that evaluation, and the importance of remaining in the ED until their evaluation is complete.  CIWA 13  Nursing or patient needs room in back.  Please orders for Ativan, IV fluids

## 2022-02-08 NOTE — ED Notes (Signed)
HR jumped up to 200 when assessing vitals

## 2022-02-09 DIAGNOSIS — E872 Acidosis, unspecified: Secondary | ICD-10-CM | POA: Diagnosis present

## 2022-02-09 DIAGNOSIS — F10932 Alcohol use, unspecified with withdrawal with perceptual disturbance: Secondary | ICD-10-CM | POA: Diagnosis not present

## 2022-02-09 LAB — COMPREHENSIVE METABOLIC PANEL
ALT: 27 U/L (ref 0–44)
AST: 28 U/L (ref 15–41)
Albumin: 4.4 g/dL (ref 3.5–5.0)
Alkaline Phosphatase: 87 U/L (ref 38–126)
Anion gap: 11 (ref 5–15)
BUN: 18 mg/dL (ref 6–20)
CO2: 24 mmol/L (ref 22–32)
Calcium: 9.2 mg/dL (ref 8.9–10.3)
Chloride: 105 mmol/L (ref 98–111)
Creatinine, Ser: 0.79 mg/dL (ref 0.61–1.24)
GFR, Estimated: >60 mL/min (ref >60)
Glucose, Bld: 117 mg/dL — ABNORMAL HIGH (ref 70–99)
Potassium: 4.3 mmol/L (ref 3.5–5.1)
Sodium: 140 mmol/L (ref 135–145)
Total Bilirubin: 1.7 mg/dL — ABNORMAL HIGH (ref 0.3–1.2)
Total Protein: 8 g/dL (ref 6.5–8.1)

## 2022-02-09 LAB — CBC
HCT: 43.3 % (ref 39.0–52.0)
Hemoglobin: 15 g/dL (ref 13.0–17.0)
MCH: 32.3 pg (ref 26.0–34.0)
MCHC: 34.6 g/dL (ref 30.0–36.0)
MCV: 93.3 fL (ref 80.0–100.0)
Platelets: 306 10*3/uL (ref 150–400)
RBC: 4.64 MIL/uL (ref 4.22–5.81)
RDW: 13 % (ref 11.5–15.5)
WBC: 12.8 10*3/uL — ABNORMAL HIGH (ref 4.0–10.5)
nRBC: 0 % (ref 0.0–0.2)

## 2022-02-09 LAB — LACTIC ACID, PLASMA: Lactic Acid, Venous: 2 mmol/L (ref 0.5–1.9)

## 2022-02-09 LAB — MAGNESIUM: Magnesium: 2.2 mg/dL (ref 1.7–2.4)

## 2022-02-09 LAB — HIV ANTIBODY (ROUTINE TESTING W REFLEX): HIV Screen 4th Generation wRfx: NONREACTIVE

## 2022-02-09 MED ORDER — PROPRANOLOL HCL 20 MG PO TABS: 60.0000 mg | ORAL_TABLET | Freq: Two times a day (BID) | ORAL | Status: DC

## 2022-02-09 MED ORDER — TIZANIDINE HCL 4 MG PO TABS: 4.0000 mg | ORAL_TABLET | Freq: Three times a day (TID) | ORAL | Status: DC | PRN

## 2022-02-09 MED ORDER — TRAZODONE HCL 50 MG PO TABS
50.0000 mg | ORAL_TABLET | Freq: Every day | ORAL | Status: DC
  Administered 2022-02-09 (×2): 50 mg via ORAL
  Filled 2022-02-09 (×2): qty 1

## 2022-02-09 MED ORDER — BUPROPION HCL ER (XL) 150 MG PO TB24
150.0000 mg | ORAL_TABLET | Freq: Every morning | ORAL | Status: DC
  Administered 2022-02-09 – 2022-02-10 (×2): 150 mg via ORAL
  Filled 2022-02-09 (×2): qty 1

## 2022-02-09 MED ORDER — LAMOTRIGINE 100 MG PO TABS
100.0000 mg | ORAL_TABLET | Freq: Two times a day (BID) | ORAL | Status: DC
  Administered 2022-02-09 – 2022-02-10 (×4): 100 mg via ORAL
  Filled 2022-02-09 (×4): qty 1

## 2022-02-09 MED ORDER — PROPRANOLOL HCL 20 MG PO TABS
60.0000 mg | ORAL_TABLET | Freq: Two times a day (BID) | ORAL | Status: DC
  Administered 2022-02-09 – 2022-02-10 (×3): 60 mg via ORAL
  Filled 2022-02-09 (×3): qty 3

## 2022-02-09 MED ORDER — SERTRALINE HCL 50 MG PO TABS
150.0000 mg | ORAL_TABLET | Freq: Every day | ORAL | Status: DC
  Administered 2022-02-09 – 2022-02-10 (×2): 150 mg via ORAL
  Filled 2022-02-09: qty 3
  Filled 2022-02-09: qty 1

## 2022-02-09 MED ORDER — AMLODIPINE BESYLATE 5 MG PO TABS
5.0000 mg | ORAL_TABLET | Freq: Every day | ORAL | Status: DC
  Administered 2022-02-09 – 2022-02-10 (×2): 5 mg via ORAL
  Filled 2022-02-09 (×2): qty 1

## 2022-02-09 MED ORDER — ARIPIPRAZOLE 5 MG PO TABS
5.0000 mg | ORAL_TABLET | Freq: Every day | ORAL | Status: DC
  Administered 2022-02-09 – 2022-02-10 (×2): 5 mg via ORAL
  Filled 2022-02-09 (×2): qty 1

## 2022-02-09 MED ORDER — FAMOTIDINE 20 MG PO TABS
40.0000 mg | ORAL_TABLET | Freq: Every day | ORAL | Status: DC | PRN
  Administered 2022-02-10: 40 mg via ORAL
  Filled 2022-02-09: qty 2

## 2022-02-09 MED ORDER — SODIUM CHLORIDE 0.9 % IV SOLN: INTRAVENOUS | Status: DC

## 2022-02-09 MED ORDER — BUSPIRONE HCL 10 MG PO TABS
10.0000 mg | ORAL_TABLET | Freq: Three times a day (TID) | ORAL | Status: DC
  Administered 2022-02-09 – 2022-02-10 (×4): 10 mg via ORAL
  Filled 2022-02-09 (×4): qty 1

## 2022-02-09 MED ORDER — MIRTAZAPINE 15 MG PO TABS
30.0000 mg | ORAL_TABLET | Freq: Every day | ORAL | Status: DC
  Administered 2022-02-09 (×2): 30 mg via ORAL
  Filled 2022-02-09: qty 1
  Filled 2022-02-09: qty 2

## 2022-02-09 NOTE — Assessment & Plan Note (Signed)
WBC 16.1 on admission.  Likely reactive leukocytosis without obvious infectious process.  Continue to monitor.

## 2022-02-09 NOTE — Hospital Course (Signed)
Ricky Ford is a 36 y.o. male with medical history significant for polysubstance use disorder (alcohol, crack cocaine, methamphetamine, THC, tobacco), chronic migraines, PTSD, depression, anxiety who is admitted with acute alcohol withdrawal.

## 2022-02-09 NOTE — Assessment & Plan Note (Signed)
Following with neurology on multiple medications. -Resume home propranolol, Lamictal, tizanidine

## 2022-02-09 NOTE — Progress Notes (Signed)
PROGRESS NOTE  Ricky Ford  AOZ:[id number] DOB: [birth year] DOA: 02/08/2022 PCP: Pcp, No   Brief Narrative:  Patient is a 36 year old male with history of polysubstance abuse, migraine, PTSD, anxiety, depression who presented here with complaints of nausea, vomiting.  He recently had 5 days of alcohol binge drinking.  He was also using cocaine, there was concern that it was laced with fentanyl.  He went to behavioral health to seek detox assistance but was sent to the emergency department after he was found to be tachycardic.  On presentation he was complaining of abdominal pain, nausea and vomiting.  Hypertensive on presentation.  Lab work showed leukocytosis, lactate of 6.  Elevated ethanol level.  UDS positive for THC.  Patient was admitted for the management of alcohol withdrawal.  Currently on CIWA protocol, IV fluids.  Assessment & Plan:  Principal Problem:   Alcohol withdrawal syndrome with perceptual disturbance (HCC) Active Problems:   Leukocytosis   Lactic acidosis   Chronic migraine w/o aura w/o status migrainosus, not intractable   MDD (major depressive disorder)   Polysubstance use disorder  Alcohol withdrawal syndrome: Reported 5 days history of alcohol use.  Ethanol level elevated, patient intoxicated on presentation.  Monitor CIWA .  Continue thiamine, folic acid.  On Librium taper.  Lactic acidosis: Elevated lactate of 6.7 on presentation.  Increased anion gap now resolved .likely induced by dehydration secondary to alcohol use.  Continue IV fluids  Leukocytosis: Most likely reactive.  Improved  Polysubstance abuse: Ongoing alcohol, cocaine, THC, vaping.  TOC  consulted.  Major depressive disorder: On Abilify, Wellbutrin, BuSpar, sertraline, Remeron at home.We recommend to follow up with psychiatry as an outpatient  Chronic migraine: Follows with neurology.  On propranolol, Lamictal and tizanidine at home.         DVT prophylaxis:enoxaparin (LOVENOX) injection  40 mg Start: 02/09/22 1000     Code Status: Full Code  Family Communication: None at bedside  Patient status:Obs  Patient is from :Home  Anticipated discharge NG:EXBM  Estimated DC date:1-2 days   Consultants: None  Procedures:None  Antimicrobials:  Anti-infectives (From admission, onward)    None       Subjective: Patient seen and examined at the bedside this morning.  Hemodynamically stable during my evaluation, currently alert and oriented.  Feels very weak.  We discussed about monitoring for next 24 to 48 hours regarding his alcohol withdrawal.  Denies any other new complaints today.  Objective: Vitals:   02/09/22 0400 02/09/22 0430 02/09/22 0530 02/09/22 0616  BP: (!) 107/59 126/86 126/67 117/84  Pulse:  93  86  Resp: 20 (!) 21 19 20   Temp:    98.9 F (37.2 C)  TempSrc:    Oral  SpO2:  99%  95%  Weight:      Height:       No intake or output data in the 24 hours ending 02/09/22 0741 Filed Weights   02/08/22 1716 02/08/22 1727  Weight: 90.7 kg 90.7 kg    Examination:  General exam: Overall comfortable, not in distress HEENT: PERRL Respiratory system:  no wheezes or crackles  Cardiovascular system: S1 & S2 heard, RRR.  Gastrointestinal system: Abdomen is nondistended, soft and nontender. Central nervous system: Alert and oriented Extremities: No edema, no clubbing ,no cyanosis Skin: No rashes, no ulcers,no icterus     Data Reviewed: I have personally reviewed following labs and imaging studies  CBC: Recent Labs  Lab 02/08/22 1731 02/09/22 0240  WBC 16.1* 12.8*  HGB  17.0 15.0  HCT 50.4 43.3  MCV 93.2 93.3  PLT 386 306   Basic Metabolic Panel: Recent Labs  Lab 02/08/22 1744 02/09/22 0240  NA 142 140  K 4.0 4.3  CL 102 105  CO2 16* 24  GLUCOSE 116* 117*  BUN 16 18  CREATININE 0.89 0.79  CALCIUM 9.4 9.2  MG  --  2.2     No results found for this or any previous visit (from the past 240 hour(s)).   Radiology Studies: DG  Chest 2 View  Result Date: 02/08/2022 CLINICAL DATA:  Chest pain EXAM: CHEST - 2 VIEW COMPARISON:  12/14/2021 FINDINGS: Cardiac and mediastinal contours are within normal limits. No focal pulmonary opacity. No pleural effusion or pneumothorax. No acute osseous abnormality. IMPRESSION: No acute cardiopulmonary process. Electronically Signed   By: Wiliam Ke M.D.   On: 02/08/2022 17:57    Scheduled Meds:  amLODipine  5 mg Oral Daily   ARIPiprazole  5 mg Oral Daily   buPROPion  150 mg Oral q morning   busPIRone  10 mg Oral TID   chlordiazePOXIDE  25 mg Oral QID   Followed by   Melene Muller ON 02/10/2022] chlordiazePOXIDE  25 mg Oral TID   Followed by   Melene Muller ON 02/11/2022] chlordiazePOXIDE  25 mg Oral BH-qamhs   Followed by   Melene Muller ON 02/12/2022] chlordiazePOXIDE  25 mg Oral Daily   enoxaparin (LOVENOX) injection  40 mg Subcutaneous Q24H   folic acid  1 mg Oral Daily   lamoTRIgine  100 mg Oral BID   mirtazapine  30 mg Oral QHS   multivitamin with minerals  1 tablet Oral Daily   propranolol  60 mg Oral BID   sertraline  150 mg Oral Daily   sodium chloride flush  3 mL Intravenous Q12H   thiamine  100 mg Oral Daily   Or   thiamine  100 mg Intravenous Daily   traZODone  50 mg Oral QHS   Continuous Infusions:  sodium chloride 150 mL/hr at 02/09/22 0227     LOS: 0 days   Burnadette Pop, MD [facility] P7/26/2023, 7:41 AM

## 2022-02-09 NOTE — Assessment & Plan Note (Signed)
Lactic acid 6.7 on admission.  Likely in setting of alcohol use and overall volume depletion. -Continue IV fluid hydration and repeat lactic acid level

## 2022-02-09 NOTE — Assessment & Plan Note (Signed)
Continue home Abilify, Wellbutrin, BuSpar, sertraline, Remeron.

## 2022-02-09 NOTE — Assessment & Plan Note (Signed)
Reports ongoing alcohol, crack cocaine, THC, and vaping.

## 2022-02-09 NOTE — Assessment & Plan Note (Signed)
Reports 5-day continuous alcohol binge with last drink morning of 7/25 now feels like he is withdrawing.  Reported hallucinations to earlier providers but denies at time of admission. -Start on CIWA protocol with Ativan as needed -Start Librium taper -Thiamine, folate, MVM -Continue IV fluid hydration overnight

## 2022-02-10 ENCOUNTER — Encounter (HOSPITAL_COMMUNITY): Payer: Self-pay | Admitting: Family

## 2022-02-10 ENCOUNTER — Inpatient Hospital Stay (HOSPITAL_COMMUNITY)
Admission: AD | Admit: 2022-02-10 | Discharge: 2022-02-15 | DRG: 885 | Disposition: A | Discharge status: Home / Self Care | Payer: BC Managed Care – PPO | Source: Intra-hospital | Attending: Psychiatry | Admitting: Psychiatry

## 2022-02-10 ENCOUNTER — Other Ambulatory Visit: Payer: Self-pay

## 2022-02-10 DIAGNOSIS — I10 Essential (primary) hypertension: Secondary | ICD-10-CM | POA: Diagnosis present

## 2022-02-10 DIAGNOSIS — Z9151 Personal history of suicidal behavior: Secondary | ICD-10-CM | POA: Diagnosis not present

## 2022-02-10 DIAGNOSIS — F332 Major depressive disorder, recurrent severe without psychotic features: Secondary | ICD-10-CM | POA: Diagnosis not present

## 2022-02-10 DIAGNOSIS — F333 Major depressive disorder, recurrent, severe with psychotic symptoms: Principal | ICD-10-CM | POA: Diagnosis present

## 2022-02-10 DIAGNOSIS — F411 Generalized anxiety disorder: Secondary | ICD-10-CM | POA: Diagnosis present

## 2022-02-10 DIAGNOSIS — F17210 Nicotine dependence, cigarettes, uncomplicated: Secondary | ICD-10-CM | POA: Diagnosis present

## 2022-02-10 DIAGNOSIS — G43909 Migraine, unspecified, not intractable, without status migrainosus: Secondary | ICD-10-CM | POA: Diagnosis present

## 2022-02-10 DIAGNOSIS — F1090 Alcohol use, unspecified, uncomplicated: Secondary | ICD-10-CM

## 2022-02-10 DIAGNOSIS — K219 Gastro-esophageal reflux disease without esophagitis: Secondary | ICD-10-CM | POA: Diagnosis present

## 2022-02-10 DIAGNOSIS — F10139 Alcohol abuse with withdrawal, unspecified: Secondary | ICD-10-CM | POA: Diagnosis present

## 2022-02-10 DIAGNOSIS — G4700 Insomnia, unspecified: Secondary | ICD-10-CM | POA: Diagnosis present

## 2022-02-10 DIAGNOSIS — F1510 Other stimulant abuse, uncomplicated: Secondary | ICD-10-CM | POA: Diagnosis present

## 2022-02-10 DIAGNOSIS — F4312 Post-traumatic stress disorder, chronic: Secondary | ICD-10-CM | POA: Diagnosis present

## 2022-02-10 DIAGNOSIS — F10932 Alcohol use, unspecified with withdrawal with perceptual disturbance: Secondary | ICD-10-CM | POA: Diagnosis not present

## 2022-02-10 DIAGNOSIS — Z79899 Other long term (current) drug therapy: Secondary | ICD-10-CM | POA: Diagnosis not present

## 2022-02-10 DIAGNOSIS — F10132 Alcohol abuse with withdrawal with perceptual disturbance: Secondary | ICD-10-CM | POA: Diagnosis not present

## 2022-02-10 DIAGNOSIS — R45851 Suicidal ideations: Secondary | ICD-10-CM | POA: Diagnosis present

## 2022-02-10 LAB — SARS CORONAVIRUS 2 BY RT PCR: SARS Coronavirus 2 by RT PCR: NEGATIVE

## 2022-02-10 MED ORDER — TRAZODONE HCL 50 MG PO TABS
50.0000 mg | ORAL_TABLET | Freq: Every day | ORAL | Status: DC
  Administered 2022-02-10 – 2022-02-14 (×5): 50 mg via ORAL
  Filled 2022-02-10 (×7): qty 1

## 2022-02-10 MED ORDER — ADULT MULTIVITAMIN W/MINERALS CH
1.0000 | ORAL_TABLET | Freq: Every day | ORAL | Status: DC
  Administered 2022-02-11 – 2022-02-15 (×5): 1 via ORAL
  Filled 2022-02-10 (×6): qty 1

## 2022-02-10 MED ORDER — ONDANSETRON HCL 4 MG PO TABS: 4.0000 mg | ORAL_TABLET | Freq: Four times a day (QID) | ORAL | Status: DC | PRN

## 2022-02-10 MED ORDER — LORAZEPAM 1 MG PO TABS: 1.0000 mg | ORAL_TABLET | ORAL | Status: DC | PRN

## 2022-02-10 MED ORDER — THIAMINE HCL 100 MG PO TABS: 100.0000 mg | ORAL_TABLET | Freq: Every day | ORAL | 1 refills | Status: DC

## 2022-02-10 MED ORDER — MIRTAZAPINE 30 MG PO TABS
30.0000 mg | ORAL_TABLET | Freq: Every day | ORAL | Status: DC
  Administered 2022-02-10 – 2022-02-14 (×5): 30 mg via ORAL
  Filled 2022-02-10 (×8): qty 1

## 2022-02-10 MED ORDER — MAGNESIUM HYDROXIDE 400 MG/5ML PO SUSP: 30.0000 mL | Freq: Every day | ORAL | Status: DC | PRN

## 2022-02-10 MED ORDER — LORAZEPAM 2 MG/ML IJ SOLN: 1.0000 mg | INTRAMUSCULAR | Status: DC | PRN

## 2022-02-10 MED ORDER — BUPROPION HCL ER (XL) 150 MG PO TB24
150.0000 mg | ORAL_TABLET | Freq: Every morning | ORAL | Status: DC
  Administered 2022-02-11: 150 mg via ORAL
  Filled 2022-02-10 (×4): qty 1

## 2022-02-10 MED ORDER — PANTOPRAZOLE SODIUM 40 MG PO TBEC: 40.0000 mg | DELAYED_RELEASE_TABLET | Freq: Every day | ORAL | 0 refills | Status: DC

## 2022-02-10 MED ORDER — ONDANSETRON HCL 4 MG/2ML IJ SOLN: 4.0000 mg | Freq: Four times a day (QID) | INTRAMUSCULAR | Status: DC | PRN

## 2022-02-10 MED ORDER — SERTRALINE HCL 50 MG PO TABS
150.0000 mg | ORAL_TABLET | Freq: Every day | ORAL | Status: DC
  Administered 2022-02-11 – 2022-02-14 (×4): 150 mg via ORAL
  Filled 2022-02-10 (×5): qty 1

## 2022-02-10 MED ORDER — ARIPIPRAZOLE 5 MG PO TABS
5.0000 mg | ORAL_TABLET | Freq: Every day | ORAL | Status: DC
  Administered 2022-02-11: 5 mg via ORAL
  Filled 2022-02-10 (×3): qty 1

## 2022-02-10 MED ORDER — PROPRANOLOL HCL 60 MG PO TABS
60.0000 mg | ORAL_TABLET | Freq: Two times a day (BID) | ORAL | Status: DC
  Administered 2022-02-10 – 2022-02-11 (×2): 60 mg via ORAL
  Filled 2022-02-10 (×6): qty 1

## 2022-02-10 MED ORDER — FAMOTIDINE 20 MG PO TABS: 40.0000 mg | ORAL_TABLET | Freq: Every day | ORAL | Status: DC | PRN

## 2022-02-10 MED ORDER — BUSPIRONE HCL 10 MG PO TABS
10.0000 mg | ORAL_TABLET | Freq: Three times a day (TID) | ORAL | Status: DC
  Administered 2022-02-10 – 2022-02-15 (×14): 10 mg via ORAL
  Filled 2022-02-10 (×17): qty 1

## 2022-02-10 MED ORDER — ACETAMINOPHEN 325 MG PO TABS: 650.0000 mg | ORAL_TABLET | Freq: Four times a day (QID) | ORAL | Status: DC | PRN

## 2022-02-10 MED ORDER — THIAMINE HCL 100 MG/ML IJ SOLN: 100.0000 mg | Freq: Every day | INTRAMUSCULAR | Status: DC

## 2022-02-10 MED ORDER — LAMOTRIGINE 100 MG PO TABS
100.0000 mg | ORAL_TABLET | Freq: Two times a day (BID) | ORAL | Status: DC
  Administered 2022-02-10 – 2022-02-11 (×2): 100 mg via ORAL
  Filled 2022-02-10 (×7): qty 1

## 2022-02-10 MED ORDER — TIZANIDINE HCL 2 MG PO TABS: 4.0000 mg | ORAL_TABLET | Freq: Three times a day (TID) | ORAL | Status: DC | PRN

## 2022-02-10 MED ORDER — FOLIC ACID 1 MG PO TABS
1.0000 mg | ORAL_TABLET | Freq: Every day | ORAL | Status: DC
  Administered 2022-02-11 – 2022-02-15 (×5): 1 mg via ORAL
  Filled 2022-02-10 (×6): qty 1

## 2022-02-10 MED ORDER — AMLODIPINE BESYLATE 5 MG PO TABS
5.0000 mg | ORAL_TABLET | Freq: Every day | ORAL | Status: DC
  Administered 2022-02-11 – 2022-02-15 (×5): 5 mg via ORAL
  Filled 2022-02-10 (×6): qty 1

## 2022-02-10 MED ORDER — ALUM & MAG HYDROXIDE-SIMETH 200-200-20 MG/5ML PO SUSP: 30.0000 mL | ORAL | Status: DC | PRN

## 2022-02-10 MED ORDER — THIAMINE HCL 100 MG PO TABS
100.0000 mg | ORAL_TABLET | Freq: Every day | ORAL | Status: DC
  Administered 2022-02-11: 100 mg via ORAL
  Filled 2022-02-10 (×2): qty 1

## 2022-02-10 MED ORDER — FOLIC ACID 1 MG PO TABS: 1.0000 mg | ORAL_TABLET | Freq: Every day | ORAL | 1 refills | Status: DC

## 2022-02-10 NOTE — Tx Team (Signed)
Initial Treatment Plan 02/10/2022 4:38 PM Kipper Buch [id number]:[id number]    PATIENT STRESSORS: Financial difficulties   Occupational concerns   Substance abuse     PATIENT STRENGTHS: Capable of independent living  Financial means  Supportive family/friends  Work skills    PATIENT IDENTIFIED PROBLEMS: Alterations in mood (Anxiety & Depression) "I feel anxious and depressed feeling inadequate, being a burden on my parents & my girlfriend".    Polysubstance abuse "I last use crack on Monday and drank alcohol on Tuesday"    Occupational / Financial concern "I'm worried about missing work and money now being in here"             DISCHARGE CRITERIA:  Improved stabilization in mood, thinking, and/or behavior Verbal commitment to aftercare and medication compliance Withdrawal symptoms are absent or subacute and managed without 24-hour nursing intervention  PRELIMINARY DISCHARGE PLAN: Outpatient therapy Return to previous living arrangement Return to previous work or school arrangements  PATIENT/FAMILY INVOLVEMENT: This treatment plan has been presented to and reviewed with the patient. The patient have been given the opportunity to ask questions and make suggestions.  Sherryl Manges, RN 02/10/2022, 4:38 PM

## 2022-02-10 NOTE — [facility] Suicide Risk Assessment (Cosign Needed)
   Disposition: As per [facility] notes, Ricky Ford is medically cleared from [facility] and patient is recommended for inpatient psychiatric admission at [facility].  [facility] treatment team and Dr. Lucianne Muss made aware of patient disposition.  Alan Mulder, NP Psychiatry.

## 2022-02-10 NOTE — Progress Notes (Signed)
Pt admitted to [facility] from [facility] where he presented initially requesting alcohol detox with complaints of nausea and vomiting. Pt presents anxious with flat affect and depressed mood. Pt does have a history PTSD, anxiety, depression and migraine. Pt denies SI, HI, AVH and pain when assessed. Rates his anxiety and depression 7/10 "I feel worthless, I'm a burden to my girlfriend and my parents. I work with my ex-girlfriend which is stressful. "I've been drinking for years, been to multiple rehabs, I just tired. I drink a pt of liquor daily. I last drank on Monday and used cocaine on Tuesday". Ambulatory to unit with steady gait. Skin intact (tattoos scattered all over body) without areas of breakdown. Belongings deemed contraband secured in locker. Safety checks initiated at Q 15 minutes intervals without self harm gestures or outburst. Unit orientation done, routines discussed, care plan reviewed with pt and documents signed. Pt cooperative with care, tolerates meals and fluids well. Denies concerns at this time. Appears to be in no physical distress without withdrawal symptoms.

## 2022-02-10 NOTE — Discharge Summary (Signed)
Physician Discharge Summary  Ricky Ford EXB:[id number] DOB: 07/21/1985 DOA: 02/08/2022  PCP: Pcp, No  Admit date: 02/08/2022 Discharge date: 02/10/2022  Admitted From: Home Disposition:  Home  Discharge Condition:Stable CODE STATUS:FULL Diet recommendation: Regular   Brief/Interim Summary: Patient is a 36 year old male with history of polysubstance abuse, migraine, PTSD, anxiety, depression who presented here with complaints of nausea, vomiting.  He recently had 5 days of alcohol binge drinking.  He was also using cocaine, there was concern that it was laced with fentanyl.  He went to behavioral health to seek detox assistance but was sent to the emergency department after he was found to be tachycardic.  On presentation he was complaining of abdominal pain, nausea and vomiting.  Hypertensive on presentation.  Lab work showed leukocytosis, lactate of 6.  Elevated ethanol level.  UDS positive for THC.  Patient was admitted for the management of alcohol withdrawal.  Treated with  CIWA protocol, IV fluids.  Currently hemodynamically stable, alert oriented, feels better.  Medically stable for discharge, planning to be transferred to behavioral health for detoxification.  TOC following.  Following problems were addressed during this hospitalization:  Alcohol withdrawal syndrome: Reported 5 days history of alcohol binge.  Ethanol level elevated, patient intoxicated on presentation. Started on CIWA protocol.  Currently alert and oriented.  Continue thiamine, folic acid.    Lactic acidosis: Elevated lactate of 6.7 on presentation.  Increased anion gap now resolved .likely induced by dehydration secondary to alcohol use.    Leukocytosis: Most likely reactive.  Improved   Polysubstance abuse: Ongoing alcohol, cocaine, THC, vaping.  TOC  consulted.  Plan for transfer to behavioral health for detoxification.  Psychiatry consulted before transfer.   Major depressive disorder: On Abilify, Wellbutrin,  BuSpar, sertraline, Remeron at home.psychiatry consulted before transfer.  We recommend to follow up with psychiatry as an outpatient   Chronic migraine: Follows with neurology.  On propranolol, Lamictal and tizanidine at home.    Discharge Diagnoses:  Principal Problem:   Alcohol withdrawal syndrome with perceptual disturbance (HCC) Active Problems:   Leukocytosis   Lactic acidosis   Chronic migraine w/o aura w/o status migrainosus, not intractable   MDD (major depressive disorder)   Polysubstance use disorder    Discharge Instructions  Discharge Instructions     Diet general   Complete by: As directed    Discharge instructions   Complete by: As directed    1)Please take prescribed medications as instructed 2)Follow up with Behavioral health   Increase activity slowly   Complete by: As directed       Allergies as of 02/10/2022       Reactions   Pollen Extract Itching        Medication List     STOP taking these medications    ibuprofen 600 MG tablet Commonly known as: ADVIL       TAKE these medications    acetaminophen 650 MG CR tablet Commonly known as: Tylenol 8 Hour Take 1 tablet (650 mg total) by mouth every 8 (eight) hours as needed for pain or fever.   amLODipine 5 MG tablet Commonly known as: NORVASC Take 5 mg by mouth daily.   ARIPiprazole 5 MG tablet Commonly known as: ABILIFY Take 5 mg by mouth daily.   buPROPion 150 MG 24 hr tablet Commonly known as: WELLBUTRIN XL Take 150 mg by mouth every morning.   busPIRone 10 MG tablet Commonly known as: BUSPAR Take 1 tablet (10 mg total) by mouth 3 (three)  times daily.   famotidine 40 MG tablet Commonly known as: PEPCID TAKE 1 TABLET BY MOUTH EVERY DAY IN THE EVENING What changed:  how much to take how to take this when to take this reasons to take this   folic acid 1 MG tablet Commonly known as: FOLVITE Take 1 tablet (1 mg total) by mouth daily.   GNP Melatonin 10 MG Subl Generic  drug: Melatonin Place 1 tablet under the tongue at bedtime.   lamoTRIgine 100 MG tablet Commonly known as: LaMICtal Take 1 tablet (100 mg total) by mouth 2 (two) times daily.   mirtazapine 30 MG tablet Commonly known as: REMERON TAKE 1 TABLET BY MOUTH AT BEDTIME.   ondansetron 4 MG disintegrating tablet Commonly known as: Zofran ODT Take 1 tablet (4 mg total) by mouth every 8 (eight) hours as needed.   pantoprazole 40 MG tablet Commonly known as: Protonix Take 1 tablet (40 mg total) by mouth daily.   propranolol 60 MG tablet Commonly known as: INDERAL Take 1 tablet (60 mg total) by mouth 2 (two) times daily.   rizatriptan 10 MG disintegrating tablet Commonly known as: Maxalt-MLT Take 1 tablet (10 mg total) by mouth as needed. May repeat in 2 hours if needed   rOPINIRole 0.5 MG tablet Commonly known as: REQUIP Take 0.5 mg by mouth at bedtime.   sertraline 100 MG tablet Commonly known as: ZOLOFT Take 150 mg by mouth daily.   thiamine 100 MG tablet Commonly known as: VITAMIN B1 Take 1 tablet (100 mg total) by mouth daily.   tiZANidine 4 MG tablet Commonly known as: Zanaflex Take 1 tablet (4 mg total) by mouth every 8 (eight) hours as needed. What changed: reasons to take this   traZODone 50 MG tablet Commonly known as: DESYREL Take 50 mg by mouth at bedtime.   VITAMIN-B COMPLEX PO Take 1 tablet by mouth daily.   Vivitrol 380 MG Susr Generic drug: Naltrexone Inject 380 mg into the muscle every 30 (thirty) days.        Allergies  Allergen Reactions   Pollen Extract Itching    Consultations: Psychiatry   Procedures/Studies: DG Chest 2 View  Result Date: 02/08/2022 CLINICAL DATA:  Chest pain EXAM: CHEST - 2 VIEW COMPARISON:  12/14/2021 FINDINGS: Cardiac and mediastinal contours are within normal limits. No focal pulmonary opacity. No pleural effusion or pneumothorax. No acute osseous abnormality. IMPRESSION: No acute cardiopulmonary process.  Electronically Signed   By: Wiliam Ke M.D.   On: 02/08/2022 17:57      Subjective: Patient seen and examined at the bedside this morning.  Hemodynamically stable for discharge today.  Discharge Exam: Vitals:   02/10/22 0146 02/10/22 0300  BP: 110/82 115/85  Pulse: 62 67  Resp: 18   Temp: 97.6 F (36.4 C)   SpO2: 95%    Vitals:   02/09/22 2103 02/10/22 0146 02/10/22 0300 02/10/22 0553  BP: 119/83 110/82 115/85   Pulse: 68 62 67   Resp: 20 18    Temp: 98.1 F (36.7 C) 97.6 F (36.4 C)    TempSrc:      SpO2: 97% 95%    Weight:    91.6 kg  Height:        General: Pt is alert, awake, not in acute distress Cardiovascular: RRR, S1/S2 +, no rubs, no gallops Respiratory: CTA bilaterally, no wheezing, no rhonchi Abdominal: Soft, NT, ND, bowel sounds + Extremities: no edema, no cyanosis    The results of significant diagnostics  from this hospitalization (including imaging, microbiology, ancillary and laboratory) are listed below for reference.     Microbiology: No results found for this or any previous visit (from the past 240 hour(s)).   Labs: BNP (last 3 results) No results for input(s): "BNP" in the last 8760 hours. Basic Metabolic Panel: Recent Labs  Lab 02/08/22 1744 02/09/22 0240  NA 142 140  K 4.0 4.3  CL 102 105  CO2 16* 24  GLUCOSE 116* 117*  BUN 16 18  CREATININE 0.89 0.79  CALCIUM 9.4 9.2  MG  --  2.2   Liver Function Tests: Recent Labs  Lab 02/08/22 1744 02/09/22 0240  AST 23 28  ALT 22 27  ALKPHOS 89 87  BILITOT 0.6 1.7*  PROT 9.2* 8.0  ALBUMIN 5.0 4.4   No results for input(s): "LIPASE", "AMYLASE" in the last 168 hours. No results for input(s): "AMMONIA" in the last 168 hours. CBC: Recent Labs  Lab 02/08/22 1731 02/09/22 0240  WBC 16.1* 12.8*  HGB 17.0 15.0  HCT 50.4 43.3  MCV 93.2 93.3  PLT 386 306   Cardiac Enzymes: No results for input(s): "CKTOTAL", "CKMB", "CKMBINDEX", "TROPONINI" in the last 168  hours. BNP: Invalid input(s): "POCBNP" CBG: No results for input(s): "GLUCAP" in the last 168 hours. D-Dimer Recent Labs    02/08/22 1744  DDIMER <0.27   Hgb A1c No results for input(s): "HGBA1C" in the last 72 hours. Lipid Profile No results for input(s): "CHOL", "HDL", "LDLCALC", "TRIG", "CHOLHDL", "LDLDIRECT" in the last 72 hours. Thyroid function studies No results for input(s): "TSH", "T4TOTAL", "T3FREE", "THYROIDAB" in the last 72 hours.  Invalid input(s): "FREET3" Anemia work up No results for input(s): "VITAMINB12", "FOLATE", "FERRITIN", "TIBC", "IRON", "RETICCTPCT" in the last 72 hours. Urinalysis    Component Value Date/Time   COLORURINE YELLOW 02/08/2022 1746   APPEARANCEUR HAZY (A) 02/08/2022 1746   LABSPEC 1.024 02/08/2022 1746   PHURINE 5.0 02/08/2022 1746   GLUCOSEU NEGATIVE 02/08/2022 1746   HGBUR NEGATIVE 02/08/2022 1746   BILIRUBINUR NEGATIVE 02/08/2022 1746   KETONESUR 20 (A) 02/08/2022 1746   PROTEINUR >=300 (A) 02/08/2022 1746   NITRITE NEGATIVE 02/08/2022 1746   LEUKOCYTESUR NEGATIVE 02/08/2022 1746   Sepsis Labs Recent Labs  Lab 02/08/22 1731 02/09/22 0240  WBC 16.1* 12.8*   Microbiology No results found for this or any previous visit (from the past 240 hour(s)).  Please note: You were cared for by a hospitalist during your hospital stay. Once you are discharged, your primary care physician will handle any further medical issues. Please note that NO REFILLS for any discharge medications will be authorized once you are discharged, as it is imperative that you return to your primary care physician (or establish a relationship with a primary care physician if you do not have one) for your post hospital discharge needs so that they can reassess your need for medications and monitor your lab values.    Time coordinating discharge: 40 minutes  SIGNED:   Burnadette Pop, MD  [facility] 02/10/2022, 10:48 AM Pager [phone]  If  7PM-7AM, please contact night-coverage [email or web address] Password [email or web address]

## 2022-02-10 NOTE — Progress Notes (Signed)
Psychoeducational Group Note  Date:  02/10/2022 Time:  2104  Group Topic/Focus:  Wrap-Up Group:   The focus of this group is to help patients review their daily goal of treatment and discuss progress on daily workbooks.  Participation Level: Did Not Attend  Participation Quality:  Not Applicable  Affect:  Not Applicable  Cognitive:  Not Applicable  Insight:  Not Applicable  Engagement in Group: Not Applicable  Additional Comments:  The patient did not attend group this evening.   Hazle Coca S 02/10/2022, 9:04 PM

## 2022-02-10 NOTE — TOC Transition Note (Signed)
Transition of Care [facility]) - CM/SW Discharge Note   Patient Details  Name: Ricky Ford MRN: [id number] Date of Birth: Nov 26, 1985  Transition of Care [facility] CM/SW Contact:  Otelia Santee, LCSW Phone Number: 02/10/2022, 12:24 PM   Clinical Narrative:    Pt is to transfer to [facility] for further treatment. Per R[facility], pt has been accepted to [facility]. Bed number is 405-1. Accepting provider is  Caryn Bee, NP. Attending provider will be Abbott Pao, MD. Number for report is [phone]. The pt will be transported by [employer].     Final next level of care: Psychiatric Hospital Barriers to Discharge: No Barriers Identified   Patient Goals and CMS Choice Patient states their goals for this hospitalization and ongoing recovery are:: To return home   Choice offered to / list presented to : Patient  Discharge Placement                       Discharge Plan and Services                DME Arranged: N/A DME Agency: NA                  Social Determinants of Health (SDOH) Interventions     Readmission Risk Interventions     No data to display

## 2022-02-11 ENCOUNTER — Encounter (HOSPITAL_COMMUNITY): Payer: Self-pay

## 2022-02-11 DIAGNOSIS — F1510 Other stimulant abuse, uncomplicated: Secondary | ICD-10-CM

## 2022-02-11 DIAGNOSIS — F411 Generalized anxiety disorder: Secondary | ICD-10-CM

## 2022-02-11 DIAGNOSIS — F1090 Alcohol use, unspecified, uncomplicated: Secondary | ICD-10-CM

## 2022-02-11 LAB — TSH: TSH: 2.035 u[IU]/mL (ref 0.350–4.500)

## 2022-02-11 LAB — HEMOGLOBIN A1C
Hgb A1c MFr Bld: 5.2 % (ref 4.8–5.6)
Mean Plasma Glucose: 102.54 mg/dL

## 2022-02-11 MED ORDER — ADULT MULTIVITAMIN W/MINERALS CH: 1.0000 | ORAL_TABLET | Freq: Every day | ORAL | Status: DC

## 2022-02-11 MED ORDER — LOPERAMIDE HCL 2 MG PO CAPS
2.0000 mg | ORAL_CAPSULE | ORAL | Status: AC | PRN
  Administered 2022-02-13: 2 mg via ORAL
  Filled 2022-02-11: qty 1

## 2022-02-11 MED ORDER — HYDROCORTISONE 1 % EX CREA
TOPICAL_CREAM | Freq: Three times a day (TID) | CUTANEOUS | Status: DC
  Filled 2022-02-11: qty 28

## 2022-02-11 MED ORDER — THIAMINE HCL 100 MG PO TABS
100.0000 mg | ORAL_TABLET | Freq: Every day | ORAL | Status: DC
  Administered 2022-02-12 – 2022-02-15 (×4): 100 mg via ORAL
  Filled 2022-02-11 (×5): qty 1

## 2022-02-11 MED ORDER — NICOTINE 14 MG/24HR TD PT24
14.0000 mg | MEDICATED_PATCH | Freq: Every day | TRANSDERMAL | Status: DC
Start: 2022-02-11 — End: 2022-02-15
  Administered 2022-02-11 – 2022-02-15 (×5): 14 mg via TRANSDERMAL
  Filled 2022-02-11 (×7): qty 1

## 2022-02-11 MED ORDER — ONDANSETRON 4 MG PO TBDP: 4.0000 mg | ORAL_TABLET | Freq: Four times a day (QID) | ORAL | Status: AC | PRN

## 2022-02-11 MED ORDER — DIPHENHYDRAMINE HCL 25 MG PO CAPS
25.0000 mg | ORAL_CAPSULE | Freq: Four times a day (QID) | ORAL | Status: DC | PRN
Start: 2022-02-11 — End: 2022-02-15

## 2022-02-11 MED ORDER — LORAZEPAM 1 MG PO TABS: 1.0000 mg | ORAL_TABLET | Freq: Four times a day (QID) | ORAL | Status: AC | PRN

## 2022-02-11 MED ORDER — PROPRANOLOL HCL 40 MG PO TABS
40.0000 mg | ORAL_TABLET | Freq: Two times a day (BID) | ORAL | Status: DC
Start: 2022-02-11 — End: 2022-02-15
  Administered 2022-02-11 – 2022-02-15 (×8): 40 mg via ORAL
  Filled 2022-02-11 (×10): qty 1

## 2022-02-11 MED ORDER — HYDROXYZINE HCL 25 MG PO TABS
25.0000 mg | ORAL_TABLET | Freq: Four times a day (QID) | ORAL | Status: AC | PRN
  Administered 2022-02-11 – 2022-02-13 (×3): 25 mg via ORAL
  Filled 2022-02-11 (×4): qty 1

## 2022-02-11 MED ORDER — DIPHENHYDRAMINE HCL 25 MG PO CAPS
25.0000 mg | ORAL_CAPSULE | Freq: Two times a day (BID) | ORAL | Status: AC
  Administered 2022-02-11 – 2022-02-12 (×2): 25 mg via ORAL
  Filled 2022-02-11 (×2): qty 1

## 2022-02-11 NOTE — Group Note (Signed)
LCSW Group Therapy Note   Group Date: 02/11/2022 Start Time: 1300 End Time: 1400  Type of Therapy and Topic: Group Therapy: Control  Participation Level: Active  Description of Group: In this group patients will discuss what is out of their control, what is somewhat in their control, and what is within their control.  They will be encouraged to explore what issues they can control and what issues are out of their control within their daily lives. They will be guided to discuss their thoughts, feelings, and behaviors related to these issues. The group will process together ways to better control things that are well within our own control and how to notice and accept the things that are not within our control. This group will be process-oriented, with patients participating in exploration of their own experiences as well as giving and receiving support and challenge from other group members.  During this group 2 worksheets will be provided to each patient to follow along and fill out.   Therapeutic Goals: 1. Patient will identify what is within their control and what is not within their control. 2. Patient will identify their thoughts and feelings about having control over their own lives. 3. Patient will identify their thoughts and feelings about not having control over everything in their lives.. 4. Patient will identify ways that they can have more control over their own lives. 5. Patient will identify areas were they can allow others to help them or provide assistance.  Summary of Patient Progress: The Pt attended group and remained there the entire time.  The Pt accepted all worksheets and followed along throughout the group session.  The Pt was appropriate with their peers and demonstrated understanding of the topic being discussed.  The Pt was able to identify areas of no control and how they could break those areas down into smaller pieces to find where they had some control in the  situation.    Melissia Lahman M Myrtle Barnhard, LCSWA 02/11/2022  2:22 PM    

## 2022-02-11 NOTE — Group Note (Signed)
Recreation Therapy Group Note   Group Topic:Team Building  Group Date: 02/11/2022 Start Time: 0930 End Time: 0955 Facilitators: Caroll Rancher, LRT,CTRS Location: [address]   Recreation Therapy Notes  Goal Area(s) Addresses:  Patient will effectively work with peer towards shared goal.  Patient will identify skills used to make activity successful.  Patient will identify how skills used during activity can be applied to reach post d/c goals.   Group Description:  [employer]. In teams of 5-6, patients were given 11 craft pipe cleaners. Using the materials provided, patients were instructed to compete again the opposing team(s) to build the tallest free-standing structure from floor level. The activity was timed; difficulty increased by Clinical research associate as Production designer, theatre/television/film continued.  Systematically resources were removed with additional directions for example, placing one arm behind their back, working in silence, and shape stipulations. LRT facilitated post-activity discussion reviewing team processes and necessary communication skills involved in completion. Patients were encouraged to reflect how the skills utilized, or not utilized, in this activity can be incorporated to positively impact support systems post discharge.   Affect/Mood: Appropriate   Participation Level: Engaged   Participation Quality: Independent   Behavior: Appropriate   Speech/Thought Process: Focused   Insight: Good   Judgement: Good   Modes of Intervention: Team-building   Patient Response to Interventions:  Engaged   Education Outcome:  Acknowledges education and In group clarification offered    Clinical Observations/Individualized Feedback: Pt attended and participated in group.    Plan: Continue to engage patient in RT group sessions 2-3x/week.   Caroll Rancher, LRT,CTRS  02/11/2022 1:20 PM
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
## 2022-02-11 NOTE — Progress Notes (Signed)

## 2022-02-11 NOTE — Progress Notes (Signed)
Adult Psychoeducational Group Note  Date:  02/11/2022 Time:  10:05 AM  Group Topic/Focus:  Goals Group:   The focus of this group is to help patients establish daily goals to achieve during treatment and discuss how the patient can incorporate goal setting into their daily lives to aide in recovery.  Participation Level:  Active  Participation Quality:  Appropriate  Affect:  Appropriate  Cognitive:  Appropriate  Insight: Appropriate  Engagement in Group:  Engaged  Modes of Intervention:  Discussion  Additional Comments: Patient attended group  Ricky Ford 02/11/2022, 10:05 AM

## 2022-02-11 NOTE — BHH Group Notes (Signed)
Pt did attend AA meeting 

## 2022-02-11 NOTE — Progress Notes (Signed)
   02/11/22 1000  Psych Admission Type (Psych Patients Only)  Admission Status Voluntary  Psychosocial Assessment  Patient Complaints None  Eye Contact Fair  Facial Expression Pensive  Affect Appropriate to circumstance  Speech Logical/coherent  Interaction Other (Comment) (WDL)  Motor Activity Other (Comment) (WDL)  Appearance/Hygiene Unremarkable  Behavior Characteristics Cooperative  Mood Pleasant  Thought Process  Coherency WDL  Content WDL  Delusions WDL  Perception WDL  Hallucination None reported or observed  Judgment WDL  Confusion WDL  Danger to Self  Current suicidal ideation? Denies  Danger to Others  Danger to Others None reported or observed

## 2022-02-11 NOTE — BH IP Treatment Plan (Signed)
Interdisciplinary Treatment and Diagnostic Plan Update  02/11/2022 Time of Session: 10:55am Elvan Ebron MRN: [id number]  Principal Diagnosis: MDD (major depressive disorder), recurrent episode, severe (HCC)  Secondary Diagnoses: Principal Problem:   MDD (major depressive disorder), recurrent episode, severe (HCC) Active Problems:   GAD (generalized anxiety disorder)   Alcohol use disorder   Stimulant abuse (HCC)   Current Medications:  Current Facility-Administered Medications  Medication Dose Route Frequency Provider Last Rate Last Admin   acetaminophen (TYLENOL) tablet 650 mg  650 mg Oral Q6H PRN Starkes-Perry, Juel Burrow, FNP       alum & mag hydroxide-simeth (MAALOX/MYLANTA) 200-200-20 MG/5ML suspension 30 mL  30 mL Oral Q4H PRN Starkes-Perry, Juel Burrow, FNP       amLODipine (NORVASC) tablet 5 mg  5 mg Oral Daily Maryagnes Amos, FNP   5 mg at 02/11/22 0751   busPIRone (BUSPAR) tablet 10 mg  10 mg Oral TID Maryagnes Amos, FNP   10 mg at 02/11/22 1340   diphenhydrAMINE (BENADRYL) capsule 25 mg  25 mg Oral BID Massengill, Harrold Donath, MD       diphenhydrAMINE (BENADRYL) capsule 25 mg  25 mg Oral Q6H PRN Massengill, Nathan, MD       famotidine (PEPCID) tablet 40 mg  40 mg Oral Daily PRN Starkes-Perry, Juel Burrow, FNP       folic acid (FOLVITE) tablet 1 mg  1 mg Oral Daily Maryagnes Amos, FNP   1 mg at 02/11/22 7893   hydrocortisone cream 1 %   Topical TID Massengill, Harrold Donath, MD       hydrOXYzine (ATARAX) tablet 25 mg  25 mg Oral Q6H PRN Massengill, Harrold Donath, MD   25 mg at 02/11/22 1341   loperamide (IMODIUM) capsule 2-4 mg  2-4 mg Oral PRN Massengill, Harrold Donath, MD       LORazepam (ATIVAN) tablet 1 mg  1 mg Oral Q6H PRN Massengill, Nathan, MD       magnesium hydroxide (MILK OF MAGNESIA) suspension 30 mL  30 mL Oral Daily PRN Starkes-Perry, Juel Burrow, FNP       mirtazapine (REMERON) tablet 30 mg  30 mg Oral QHS Maryagnes Amos, FNP   30 mg at 02/10/22 2111   multivitamin  with minerals tablet 1 tablet  1 tablet Oral Daily Maryagnes Amos, FNP   1 tablet at 02/11/22 8101   nicotine (NICODERM CQ - dosed in mg/24 hours) patch 14 mg  14 mg Transdermal Daily Massengill, Harrold Donath, MD   14 mg at 02/11/22 0823   ondansetron (ZOFRAN-ODT) disintegrating tablet 4 mg  4 mg Oral Q6H PRN Massengill, Harrold Donath, MD       propranolol (INDERAL) tablet 40 mg  40 mg Oral BID Massengill, Harrold Donath, MD       sertraline (ZOLOFT) tablet 150 mg  150 mg Oral Daily Maryagnes Amos, FNP   150 mg at 02/11/22 0752   [START ON 02/12/2022] thiamine (VITAMIN B1) tablet 100 mg  100 mg Oral Daily Massengill, Nathan, MD       tiZANidine (ZANAFLEX) tablet 4 mg  4 mg Oral Q8H PRN Starkes-Perry, Juel Burrow, FNP       traZODone (DESYREL) tablet 50 mg  50 mg Oral QHS Maryagnes Amos, FNP   50 mg at 02/10/22 2111   PTA Medications: Medications Prior to Admission  Medication Sig Dispense Refill Last Dose   acetaminophen (TYLENOL 8 HOUR) 650 MG CR tablet Take 1 tablet (650 mg total) by mouth every 8 (eight)  hours as needed for pain or fever. 30 tablet 0    amLODipine (NORVASC) 5 MG tablet Take 5 mg by mouth daily.      ARIPiprazole (ABILIFY) 5 MG tablet Take 5 mg by mouth daily.      B Complex Vitamins (VITAMIN-B COMPLEX PO) Take 1 tablet by mouth daily.      buPROPion (WELLBUTRIN XL) 150 MG 24 hr tablet Take 150 mg by mouth every morning.      busPIRone (BUSPAR) 10 MG tablet Take 1 tablet (10 mg total) by mouth 3 (three) times daily. 90 tablet 2    famotidine (PEPCID) 40 MG tablet TAKE 1 TABLET BY MOUTH EVERY DAY IN THE EVENING (Patient taking differently: Take 40 mg by mouth daily as needed for heartburn or indigestion.) 30 tablet 1    folic acid (FOLVITE) 1 MG tablet Take 1 tablet (1 mg total) by mouth daily. 30 tablet 1    GNP MELATONIN 10 MG SUBL Place 1 tablet under the tongue at bedtime.      lamoTRIgine (LAMICTAL) 100 MG tablet Take 1 tablet (100 mg total) by mouth 2 (two) times daily. 60  tablet 11    mirtazapine (REMERON) 30 MG tablet TAKE 1 TABLET BY MOUTH AT BEDTIME. 90 tablet 0    ondansetron (ZOFRAN ODT) 4 MG disintegrating tablet Take 1 tablet (4 mg total) by mouth every 8 (eight) hours as needed. 20 tablet 6    pantoprazole (PROTONIX) 40 MG tablet Take 1 tablet (40 mg total) by mouth daily. 30 tablet 0    propranolol (INDERAL) 60 MG tablet Take 1 tablet (60 mg total) by mouth 2 (two) times daily. 180 tablet 4    rizatriptan (MAXALT-MLT) 10 MG disintegrating tablet Take 1 tablet (10 mg total) by mouth as needed. May repeat in 2 hours if needed 15 tablet 11    rOPINIRole (REQUIP) 0.5 MG tablet Take 0.5 mg by mouth at bedtime.      sertraline (ZOLOFT) 100 MG tablet Take 150 mg by mouth daily.      thiamine (VITAMIN B1) 100 MG tablet Take 1 tablet (100 mg total) by mouth daily. 30 tablet 1    tiZANidine (ZANAFLEX) 4 MG tablet Take 1 tablet (4 mg total) by mouth every 8 (eight) hours as needed. (Patient taking differently: Take 4 mg by mouth every 8 (eight) hours as needed (migraines).) 30 tablet 5    traZODone (DESYREL) 50 MG tablet Take 50 mg by mouth at bedtime.      VIVITROL 380 MG SUSR Inject 380 mg into the muscle every 30 (thirty) days.       Patient Stressors: Financial difficulties   Occupational concerns   Substance abuse    Patient Strengths: Capable of independent living  Contractor  Supportive family/friends  Work skills   Treatment Modalities: Medication Management, Group therapy, Case management,  1 to 1 session with clinician, Psychoeducation, Recreational therapy.   Physician Treatment Plan for Primary Diagnosis: MDD (major depressive disorder), recurrent episode, severe (HCC) Long Term Goal(s):     Short Term Goals:    Medication Management: Evaluate patient's response, side effects, and tolerance of medication regimen.  Therapeutic Interventions: 1 to 1 sessions, Unit Group sessions and Medication administration.  Evaluation of Outcomes:  Progressing  Physician Treatment Plan for Secondary Diagnosis: Principal Problem:   MDD (major depressive disorder), recurrent episode, severe (HCC) Active Problems:   GAD (generalized anxiety disorder)   Alcohol use disorder   Stimulant abuse (HCC)  Long Term Goal(s):     Short Term Goals:       Medication Management: Evaluate patient's response, side effects, and tolerance of medication regimen.  Therapeutic Interventions: 1 to 1 sessions, Unit Group sessions and Medication administration.  Evaluation of Outcomes: Progressing   RN Treatment Plan for Primary Diagnosis: MDD (major depressive disorder), recurrent episode, severe (HCC) Long Term Goal(s): Knowledge of disease and therapeutic regimen to maintain health will improve  Short Term Goals: Ability to remain free from injury will improve, Ability to verbalize frustration and anger appropriately will improve, Ability to demonstrate self-control, Ability to participate in decision making will improve, Ability to verbalize feelings will improve, Ability to disclose and discuss suicidal ideas, Ability to identify and develop effective coping behaviors will improve, and Compliance with prescribed medications will improve  Medication Management: RN will administer medications as ordered by provider, will assess and evaluate patient's response and provide education to patient for prescribed medication. RN will report any adverse and/or side effects to prescribing provider.  Therapeutic Interventions: 1 on 1 counseling sessions, Psychoeducation, Medication administration, Evaluate responses to treatment, Monitor vital signs and CBGs as ordered, Perform/monitor CIWA, COWS, AIMS and Fall Risk screenings as ordered, Perform wound care treatments as ordered.  Evaluation of Outcomes: Progressing   LCSW Treatment Plan for Primary Diagnosis: MDD (major depressive disorder), recurrent episode, severe (HCC) Long Term Goal(s): Safe transition to  appropriate next level of care at discharge, Engage patient in therapeutic group addressing interpersonal concerns.  Short Term Goals: Engage patient in aftercare planning with referrals and resources, Increase social support, Increase ability to appropriately verbalize feelings, Increase emotional regulation, Facilitate acceptance of mental health diagnosis and concerns, Facilitate patient progression through stages of change regarding substance use diagnoses and concerns, Identify triggers associated with mental health/substance abuse issues, and Increase skills for wellness and recovery  Therapeutic Interventions: Assess for all discharge needs, 1 to 1 time with Social worker, Explore available resources and support systems, Assess for adequacy in community support network, Educate family and significant other(s) on suicide prevention, Complete Psychosocial Assessment, Interpersonal group therapy.  Evaluation of Outcomes: Progressing   Progress in Treatment: Attending groups: Yes. Participating in groups: Yes. Taking medication as prescribed: Yes. Toleration medication: Yes. Family/Significant other contact made: No, will contact:  Harland German, girlfriend Patient understands diagnosis: Yes. Discussing patient identified problems/goals with staff: Yes. Medical problems stabilized or resolved: Yes. Denies suicidal/homicidal ideation: Yes. Issues/concerns per patient self-inventory: No.   New problem(s) identified: No, Describe:  none reported   New Short Term/Long Term Goal(s): detox, medication management for mood stabilization; elimination of SI thoughts; development of comprehensive mental wellness/sobriety plan     Patient Goals:  Pt states that he would like to work on meds that would help with depression and keep him away from drinking  Discharge Plan or Barriers: Patient recently admitted. CSW will continue to follow and assess for appropriate referrals and possible discharge  planning.    Reason for Continuation of Hospitalization: Anxiety Depression Medication stabilization Suicidal ideation Withdrawal symptoms  Estimated Length of Stay:  Last 3 [place] Suicide Severity Risk Score: Flowsheet Row [facility] (Current[facility] Most recent reading at 02/10/2022  4:00 PM ED to Hosp-Admission (Discharged) from 02/08/2022 in [facility] Most recent reading at 02/08/2022  5:29 PM OP [facility] Most recent reading at 02/08/2022  4:20 PM  C-SSRS RISK CATEGORY  Error: Q7 should not be populated when Q6 is No No Risk No Risk       Last PHQ 2/9 Scores:    09/29/2020    2:26 PM  Depression screen PHQ 2/9  Decreased Interest 1  Down, Depressed, Hopeless 1  PHQ - 2 Score 2  Altered sleeping 1  Tired, decreased energy 1  Change in appetite 2  Feeling bad or failure about yourself  2  Trouble concentrating 2  Moving slowly or fidgety/restless 2  Suicidal thoughts 1  PHQ-9 Score 13  Difficult doing work/chores Very difficult    Scribe for Treatment Team: Beatris Si, LCSW 02/11/2022 3:25 PM
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
## 2022-02-12 LAB — LIPID PANEL
Cholesterol: 206 mg/dL — ABNORMAL HIGH (ref 0–200)
HDL: 71 mg/dL (ref >40)
LDL Cholesterol: 97 mg/dL (ref 0–99)
Total CHOL/HDL Ratio: 2.9 RATIO
Triglycerides: 192 mg/dL — ABNORMAL HIGH (ref <150)
VLDL: 38 mg/dL (ref 0–40)

## 2022-02-12 NOTE — Group Note (Signed)
LCSW Group Therapy Note  02/12/2022     Type of Therapy and Topic:  Group Therapy: Anger and Coping Skills  Participation Level:  Active   Description of Group:   In this group, patients learned how to recognize the physical, cognitive, emotional, and behavioral responses they have to anger-provoking situations.  They identified how they usually or often react when angered, and learned how healthy and unhealthy coping skills work initially, but the unhealthy ones stop working.   They analyzed how their frequently-chosen coping skill is possibly beneficial and how it is possibly unhelpful.  The group discussed a variety of healthier coping skills that could help in resolving the actual issues, as well as how to go about planning for the the possibility of future similar situations.  Therapeutic Goals: Patients will identify one thing that makes them angry and how they feel emotionally and physically, what their thoughts are or tend to be in those situations, and what healthy or unhealthy coping mechanism they typically use Patients will identify how their coping technique works for them, as well as how it works against them. Patients will explore possible new behaviors to use in future anger situations. Patients will learn that anger itself is normal and cannot be eliminated, and that healthier coping skills can assist with resolving conflict rather than worsening situations.  Summary of Patient Progress:  The patient shared that he often is angered by people not using their turn signal and chooses to cope with these feelings by blowing his horn.  Later he talked about more serious anger issues and how he uses alcohol to deal with his stress.  The patient sees this coping skill as unhealthy and is willing to work on others that will be more helpful.  Therapeutic Modalities:   Cognitive Behavioral Therapy   Lynnell Chad  .
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
## 2022-02-12 NOTE — [facility] Suicide Risk Assessment (Addendum)
[facility] Admission Suicide Risk Assessment   Nursing information obtained from:    Demographic factors:  Male, Adolescent or young adult, Caucasian Current Mental Status:  NA Loss Factors:  Decrease in vocational status, Financial problems / change in socioeconomic status Historical Factors:  Family history of mental illness or substance abuse (Bio father has addiction) Risk Reduction Factors:  Employed, Living with another person, especially a relative, Positive social support, Sense of responsibility to family  Total Time spent with patient: 45 minutes Principal Problem: MDD (major depressive disorder), recurrent episode, severe (HCC) Diagnosis:  Principal Problem:   MDD (major depressive disorder), recurrent episode, severe (HCC) Active Problems:   GAD (generalized anxiety disorder)   Alcohol use disorder   Stimulant abuse (HCC)  Subjective Data:   History of Present Illness: Ricky Ford is a 36 y.o. man with a history of anxiety, PTSD, MDD, and polysubstance use who presented to ED for medical evaluation of withdrawal (after initially presenting to [facility] with concerns for worsening depression, anxiety, desire for detox). He is now admitted to [facility] after inpatient medical stabilization.     ED Course: In emergency department patient was tachycardic to 150s and hypertensive also with vomiting, tremors, and diaphoresis secondary to alcohol withdrawal. Also with lactic acid elevated to 6.7. However these symptoms largely improved after administration of Ativan with improvement also reflected in downtrend in lactate and improved blood gasses. Prior to admission to [facility], he received initial 2 mg IV and PO doses of Ativan followed by six doses of Librium 25 mg with the most recent dose received today at 9AM. Of note, he was also re-started on many of his home medications including lamotrigine-receiving three 100 mg doses over the previous day (although reports to [place] that he does not  take mediations regularly at home).       Current Outpatient (Home) Medication List: -Abilify 5 mg daily (not taking regularly)  -Wellbutrin 150 mg daily  (not taking regularly)  -BuSpar 10 mg three times daily  (not taking regularly)  -Trazodone 50 mg each night  (not taking regularly)  -Zoloft 150 mg daily  (not taking regularly)  -Mirtazapine 30 mg daily  (not taking regularly)  -Ropinirole 0.5 mg each night  (not taking regularly)  -Lamotrigine 100 mg twice daily  (not taking regularly)  -Tizanidine 4 mg q8h PRN  (not taking regularly)  -Propranolol 60 mg twice daily  (not taking regularly)  -Amlodipine 5 mg daily  (not taking regularly)  -Rizatriptan 10 mg PRN for migraine  (not taking regularly)    On evaluation today, the pt reports worsening depression for some time and SI leading up to this admission. He reports the following stressors causing worsening of mood and SI: relapse on alcohol, guilt over drinking, conflict with s/o, poor adherence with medications, finanical issues.    At the visit today, the patient reports their mood is "[depressed, down, sad]". The patient reports that current depressive episode began [many months ago, worsening over the last 3 weeks]. The pt [reports] pervasive sadness. Pt [reports] anhedonia.  The patient [reports] changes in sleep, [poor sleep with poor quality of sleep and initial and middle insomnia]. The patient [reports/denies] daytime fatigue.  The patient [reports] changes in appetite [without] associated weight loss/gain.  The patient [reports] changes in ability to concentrate.  The patient [reports] having suicidal thoughts leading up to this admission. On assessment today, he reports that he continues to have passive SI w/o intent or plan.  Denies HI.  The patient reports having difficulty managing anxieyt/worry for many years.  The patient reports that current treatment  [not effective].  At today's visit, the patient reports the  anxiety [is] out of proportion with stressors. Reports that anxiety is generalized. Patient reports having difficulty controlling worry.  Patient reports that anxiety causes feelings of restlessness or being on edge, easily fatigued, concentration difficulty, irritability, muscle tension, and sleep disturbance.   Reports anxiety attacks/ panic attacks off and on.    Reports AH of hearing his name called and mood congruent AH that he is worthless, when depression is severe. Denies VH. Denies paranoia and other psychotic sx.   The patient denies past or current symptoms of mania/hypomania, including symptoms of: grandiosity and inflated self-esteem, decreased need for sleep, pressured speech, flight of ideas and racing thoughts, distractibility or inattention, risk-taking activities, and denies impulse to participate in activities that have dangerous, harmful, or painful potential consequences.       He reports significant alcohol use preceding admission - During his preceding five-day binge he drank about a pint of alcohol a day. His last drink was the morning of 7/25. In the hours following alcohol cessation he developed symptoms of anxiety, sweating, feeling on edge, nausea and vomiting, as well as paresthesias to bilateral hands. He is hoping to have help with his depression and go to rehab.  He also reports yesterday he used crack cocaine, and when his dad went to go check on him he found him to be unresponsive and is concerned that it was laced with something else. Denies h/o alcohol w/d seizure or Dts.      Past Psychiatric Hx: Previous Psych Diagnoses: MDD, GAD, alcohol use disorder, PTSD Prior inpatient treatment: Denies prior inpatient psychiatric treatment Prior rehab hx: Patient describes three rounds of rehab for alcohol use disorder starting in the Spring of 2022 with the most recent ending 01/04/2022 History of suicide: Questionable history of one prior suicide attempt (found in bed  inebriated with several prescription pills) History of homicide: None Psychiatric medication history: In addition to regimen listed above, patient also has a history of trying Vyvanse, Strattera, and Vivitrol Psychiatric medication compliance history: Intermittent compliance to medication regimen secondary to alcohol intoxication. Most recently took prescriptions 7/23 prior to admission   Substance Abuse Hx: Alcohol: Started drinking at 47 but not a heavy drinker until starting his associates degree. Denies problematic use after college until recent years in which his consumption has steadily increased to the point of binges. He describes a history of unpleasant withdrawal but never with seizure or requiring inpatient medical care Tobacco: Patient stopped smoking 07/18/2021 and now vapes nicotine while weaning off. He consumes approximately one disposable vape per week. Illicit drugs: Patient experimented with drugs in college including cannabis, Xanax, ecstasy, cocaine, and LSD but denies history of problematic abuse. He says that the first and only time he tried heroin he unintentionally overdosed and reports never trying it again. Rx drug abuse: Patient has taken non-prescribed percocet in the past but never for prolonged periods   Past Medical History: Medical Diagnoses: Hypertension, migraines Home Rx: As above Prior Hosp: None reported Head trauma, LOC, concussions, seizures: Denies any history of seizures Allergies: NKDA   Family History: Patient is adopted and reports no knowledge of medical or psychiatric family history   Social History: Mr. Withrow lives in a house he rents with his girlfriend in [place]. They do not have any children. 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 He has a support network including a support girlfriend, a close friend living out of state, and parents that he can depend on.   Continued Clinical Symptoms:   Alcohol Use Disorder Identification Test Final Score (AUDIT): 28 The "Alcohol Use Disorders Identification Test", Guidelines for Use in Primary Care, Second Edition.  World Science writer [facility]). Score between 0-7:  no or low risk or alcohol related problems. Score between 8-15:  moderate risk of alcohol related problems. Score between 16-19:  high risk of alcohol related problems. Score 20 or above:  warrants further diagnostic evaluation for alcohol dependence and treatment.   CLINICAL FACTORS:   Severe Anxiety and/or Agitation Panic Attacks Depression:   Anhedonia Hopelessness Impulsivity Severe Alcohol/Substance Abuse/Dependencies More than one psychiatric diagnosis Unstable or Poor Therapeutic Relationship Previous Psychiatric Diagnoses and Treatments    Psychiatric Specialty Exam:  Presentation  General Appearance: Appropriate for Environment; Casual; Fairly Groomed  Eye Contact:Good  Speech:Clear and Coherent; Normal Rate  Speech Volume:Normal  Handedness:Right   Mood and Affect  Mood:Anxious; Depressed  Affect:Congruent; Depressed   Thought Process  Thought Processes:Coherent; Linear  Descriptions of Associations:Intact  Orientation:Full (Time, Place and Person)  Thought Content:Logical  History of Schizophrenia/Schizoaffective disorder:No data recorded Duration of Psychotic Symptoms:No data recorded Hallucinations:No data recorded Ideas of Reference:None  Suicidal Thoughts:No data recorded Homicidal Thoughts:No data recorded  Sensorium  Memory:Immediate Fair; Remote Fair; Recent Fair  Judgment:Fair  Insight:Fair   Executive Functions  Concentration:Fair  Attention Span:Fair  Recall:Fair  Fund of Knowledge:Fair  Language:Good   Psychomotor Activity  Psychomotor Activity:No data recorded  Assets  Assets:Communication Skills; Physical Health; Social Support   Sleep  Sleep:No data recorded   Physical Exam: Physical  Exam See H&P  ROS See H&P  Blood pressure (!) 121/92, pulse 62, temperature 97.8 F (36.6 C), temperature source Oral, resp. rate 16, height 5\' 10"  (1.778 m), weight 90.1 kg, SpO2 98 %. Body mass index is 28.5 kg/m.   COGNITIVE FEATURES THAT CONTRIBUTE TO RISK:  None    SUICIDE RISK:   Moderate:  Frequent suicidal ideation with limited intensity, and duration, some specificity in terms of plans, no associated intent, good self-control, limited dysphoria/symptomatology, some risk factors present, and identifiable protective factors, including available and accessible social support.  PLAN OF CARE:   See H&P for assessment, diagnosis list, and plan.    I certify that inpatient services furnished can reasonably be expected to improve the patient's condition.   , MD 02/12/2022, 10:16 AM

## 2022-02-12 NOTE — Progress Notes (Signed)

## 2022-02-12 NOTE — Progress Notes (Signed)
   02/12/22 2100  Psych Admission Type (Psych Patients Only)  Admission Status Voluntary  Psychosocial Assessment  Patient Complaints Depression;Anxiety  Eye Contact Fair  Facial Expression Flat  Affect Appropriate to circumstance;Depressed  Speech Logical/coherent  Interaction Assertive  Motor Activity Slow  Appearance/Hygiene Unremarkable  Behavior Characteristics Cooperative  Mood Pleasant  Thought Process  Coherency WDL  Content WDL  Delusions None reported or observed  Perception WDL  Hallucination None reported or observed  Judgment WDL  Confusion None  Danger to Self  Current suicidal ideation? Denies  Danger to Others  Danger to Others None reported or observed

## 2022-02-12 NOTE — Progress Notes (Signed)
   02/12/22 1000  Psych Admission Type (Psych Patients Only)  Admission Status Voluntary  Psychosocial Assessment  Patient Complaints Anxiety;Depression  Eye Contact Fair  Facial Expression Pensive  Affect Appropriate to circumstance  Speech Logical/coherent  Interaction Other (Comment) (WDL)  Motor Activity Other (Comment) (wdl)  Appearance/Hygiene Unremarkable  Behavior Characteristics Cooperative  Mood Pleasant  Thought Process  Coherency WDL  Content WDL  Delusions WDL  Perception WDL  Hallucination None reported or observed  Judgment WDL  Confusion WDL  Danger to Self  Current suicidal ideation? Denies  Danger to Others  Danger to Others None reported or observed

## 2022-02-12 NOTE — [facility] Group Notes (Signed)
[facility] Group Notes:  (Nursing/MHT/Case Management/Adjunct)  Date:  02/12/2022  Time:  9:47 AM  Type of Therapy:   Orientation/Goals Group  Participation Level:  Active  Participation Quality:  Appropriate  Affect:  Appropriate  Cognitive:  Appropriate  Insight:  Appropriate  Engagement in Group:  Engaged  Modes of Intervention:  Education  Summary of Progress/Problems:Pt. Attended and was engaged in group.   Fatima Blank 02/12/2022, 9:47 AM

## 2022-02-12 NOTE — [facility] Group Notes (Addendum)
Adult Psychoeducational Group Note  Date:  02/12/2022 Time:  3:12 PM  Group Topic/Focus:  Coping With Mental Health Crisis:   The purpose of this group is to help patients identify strategies for coping with mental health crisis.  Group discusses possible causes of crisis and ways to manage them effectively.  Participation Level:  Active  Participation Quality:  Appropriate  Affect:  Appropriate  Cognitive:  Appropriate  Insight: Appropriate  Engagement in Group:  Engaged  Modes of Intervention:  Discussion  Additional Comments:   Patient did not attend afternoon MHT lead therapeutic group.    Geofrey Silliman W Heer Justiss 02/12/2022, 3:12 PM

## 2022-02-13 ENCOUNTER — Encounter (HOSPITAL_COMMUNITY): Payer: Self-pay | Admitting: Family
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
## 2022-02-13 NOTE — Progress Notes (Addendum)
Pt denies SI/HI/AVH and verbally agrees to approach staff if these become apparent or before harming themselves/others. Rates depression 6/10. Rates anxiety 6/10. Rates pain 0/10. Pt has been interacting well with others. Pt is more animated throughout the day. Pt did seem to get slightly irritable over the phone because he was wanting to get back to work and had thought that he had no spoken with the MD/NP today. Pt felt like his d/c plan had not been started. Pt stated that he was not going to tell the RN next time he had a BM. Pt seems to be arrogant when speaking to girlfriend. Pt states he feels like he is going stir crazy. Scheduled medications administered to pt, per MD orders. RN provided support and encouragement to pt. Q15 min safety checks implemented and continued. Pt safe on the unit. RN will continue to monitor and intervene as needed.   02/13/22 0747  Psych Admission Type (Psych Patients Only)  Admission Status Voluntary  Psychosocial Assessment  Patient Complaints Anxiety;Depression  Eye Contact Fair  Facial Expression Flat;Sad  Affect Depressed;Appropriate to circumstance  Speech Logical/coherent  Interaction Assertive  Motor Activity Other (Comment) (WDL)  Appearance/Hygiene Unremarkable  Behavior Characteristics Cooperative;Appropriate to situation;Calm  Mood Anxious;Depressed;Pleasant  Thought Process  Coherency WDL  Content WDL  Delusions None reported or observed  Perception WDL  Hallucination None reported or observed  Judgment Impaired  Confusion None  Danger to Self  Current suicidal ideation? Denies  Danger to Others  Danger to Others None reported or observed

## 2022-02-13 NOTE — Group Note (Signed)
Date:  02/13/2022 Time:  11:59 AM  Group Topic/Focus:  Goals Group:   The focus of this group is to help patients establish daily goals to achieve during treatment and discuss how the patient can incorporate goal setting into their daily lives to aide in recovery.    Participation Level:  Active  Additional Comments:  Pt has a goal today of staying positive.   Ricky Ford 02/13/2022, 11:59 AM

## 2022-02-13 NOTE — Group Note (Signed)
[facility] LCSW Group Therapy Note  Date/Time:  02/13/2022 10:00am-11:00am  Type of Therapy and Topic:  Group Therapy:  Healthy and Unhealthy Supports  Participation Level:  Minimal   Description of Group:  Patients in this group were introduced to the idea of adding a variety of healthy supports to address the various needs in their lives.  Patients discussed what additional healthy supports could be helpful in their recovery and wellness after discharge in order to prevent future hospitalizations such as counselor, doctor, other levels of psychiatric care such as ACTT services, therapy groups, 12-step groups, and problem-specific support groups.  A demonstration was given about how to set boundaries which patients expressed was beneficial.  Several songs were played to inspire patients to be more self-supportive.  Therapeutic Goals:   1)  discuss importance of adding supports to stay well once out of the hospital  2)  compare healthy versus unhealthy supports and identify some examples of each  3)  generate ideas and descriptions of healthy supports that can be added  4)  offer mutual support about how to address unhealthy supports  5)  encourage active participation in and adherence to discharge plan    Summary of Patient Progress:  The patient stated that current healthy supports in his life are his family and girlfriend who is in recovery and did not relapse when he did.  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   Therapeutic Modalities:   Motivational Interviewing Brief Solution-Focused Therapy  Ambrose Mantle, LCSW

## 2022-02-13 NOTE — Progress Notes (Signed)
   02/13/22 2130  Psych Admission Type (Psych Patients Only)  Admission Status Voluntary  Psychosocial Assessment  Patient Complaints Anxiety  Eye Contact Fair  Facial Expression Flat  Affect Appropriate to circumstance;Depressed  Speech Logical/coherent  Interaction Assertive  Motor Activity Slow  Appearance/Hygiene Unremarkable  Behavior Characteristics Cooperative;Appropriate to situation  Mood Pleasant;Anxious  Thought Process  Coherency WDL  Content WDL  Delusions None reported or observed  Perception WDL  Hallucination None reported or observed  Judgment WDL  Confusion None  Danger to Self  Current suicidal ideation? Denies  Danger to Others  Danger to Others None reported or observed

## 2022-02-13 NOTE — Progress Notes (Signed)
Adult Psychoeducational Group Note  Date:  02/13/2022 Time:  8:55 PM  Group Topic/Focus:  Wrap-Up Group:   The focus of this group is to help patients review their daily goal of treatment and discuss progress on daily workbooks.  Participation Level:  Active  Participation Quality:  Appropriate  Affect:  Appropriate  Cognitive:  Appropriate  Insight: Appropriate  Engagement in Group:  Engaged  Modes of Intervention:  Discussion  Additional Comments:   Pt attended the Wrap Up group. Pt rated his day a 7/10. Pt was able to make progress toward discharge planning and managing his mood. Pt used positive socialization and comedy as coping skills.  Ricky Ford 02/13/2022, 8:55 PM

## 2022-02-13 NOTE — [facility] Suicide Risk Assessment (Signed)
[facility] INPATIENT:  Family/Significant Other Suicide Prevention Education  Suicide Prevention Education:  Education Completed;  Ricky Ford (girlfriend) [phone]   ,  (name of family member/significant other) has been identified by the patient as the family member/significant other with whom the patient will be residing, and identified as the person(s) who will aid the patient in the event of a mental health crisis (suicidal ideations/suicide attempt).    Girlfriend states that the patient is really wanting to go home because he has nothing to do in the hospital and that leaves him more time to think and become upset, plus they really need his income in order to pay rent.  She repeated this multiple times.  There are no guns in the home.  With written consent from the patient, the family member/significant other has been provided the following suicide prevention education, prior to the and/or following the discharge of the patient.  The suicide prevention education provided includes the following: Suicide risk factors Suicide prevention and interventions National Suicide Hotline telephone number [facility] assessment telephone number [facility] Emergency Assistance 911 [facility] and/or Residential Mobile Crisis Unit telephone number  Request made of family/significant other to: Remove weapons (e.g., guns, rifles, knives), all items previously/currently identified as safety concern.   Remove drugs/medications (over-the-counter, prescriptions, illicit drugs), all items previously/currently identified as a safety concern.  The family member/significant other verbalizes understanding of the suicide prevention education information provided.  The family member/significant other agrees to remove the items of safety concern listed above.  Carloyn Jaeger Grossman-Orr 02/13/2022, 4:04 PM

## 2022-02-13 NOTE — [facility] Group Notes (Signed)
[facility] Group Notes:  (Nursing/MHT)  Date:  02/13/2022   Time:  1300  Type of Therapy:  Psychoeducational Skills  Participation Level:  Active  Participation Quality:  Appropriate  Affect:  Appropriate  Cognitive:  Alert  Insight:  Appropriate  Engagement in Group:  Engaged  Modes of Intervention:  Discussion, Exploration, Rapport Building, Socialization, and Support    Shela Nevin 02/13/2022, 5:58 PM

## 2022-02-14 MED ORDER — MELATONIN 5 MG PO TABS
5.0000 mg | ORAL_TABLET | Freq: Every day | ORAL | Status: DC
  Administered 2022-02-14: 5 mg via ORAL
  Filled 2022-02-14 (×2): qty 1

## 2022-02-14 MED ORDER — SERTRALINE HCL 100 MG PO TABS
200.0000 mg | ORAL_TABLET | Freq: Every day | ORAL | Status: DC
  Administered 2022-02-15: 200 mg via ORAL
  Filled 2022-02-14 (×2): qty 2

## 2022-02-14 MED ORDER — SERTRALINE HCL 50 MG PO TABS
50.0000 mg | ORAL_TABLET | Freq: Every day | ORAL | Status: AC
Start: 2022-02-14 — End: 2022-02-14
  Administered 2022-02-14: 50 mg via ORAL
  Filled 2022-02-14 (×2): qty 1

## 2022-02-14 MED ORDER — OLANZAPINE 5 MG PO TBDP: 5.0000 mg | ORAL_TABLET | Freq: Three times a day (TID) | ORAL | Status: DC | PRN

## 2022-02-14 MED ORDER — ZIPRASIDONE MESYLATE 20 MG IM SOLR: 20.0000 mg | Freq: Three times a day (TID) | INTRAMUSCULAR | Status: DC | PRN

## 2022-02-14 MED ORDER — LORAZEPAM 1 MG PO TABS: 1.0000 mg | ORAL_TABLET | ORAL | Status: DC | PRN

## 2022-02-14 MED ORDER — HYDROXYZINE HCL 25 MG PO TABS
25.0000 mg | ORAL_TABLET | Freq: Three times a day (TID) | ORAL | Status: DC | PRN
  Administered 2022-02-14: 25 mg via ORAL

## 2022-02-14 NOTE — [facility] Group Notes (Signed)
Spiritual care group on grief and loss facilitated by chaplain Katy Holger Sokolowski, [facility]   Group Goal:   Support / Education around grief and loss   Members engage in facilitated group support and psycho-social education.   Group Description:   Following introductions and group rules, group members engaged in facilitated group dialog and support around topic of loss, with particular support around experiences of loss in their lives. Group Identified types of loss (relationships / self / things) and identified patterns, circumstances, and changes that precipitate losses. Reflected on thoughts / feelings around loss, normalized grief responses, and recognized variety in grief experience. Group noted Worden's four tasks of grief in discussion.   Group drew on Adlerian / Rogerian, narrative, MI,   Patient Progress: Did not attend.  

## 2022-02-14 NOTE — Group Note (Signed)
LCSW Group Therapy Note   Group Date: 02/14/2022 Start Time: 1300 End Time: 1400  Type of Therapy and Topic:  Group Therapy: Thoughts, Feelings, and Actions  Participation Level:  Active   Description of Group:   In this group, each patient discussed their previous experiencing and understanding of overthinking, identifying the harmful impact on their lives. As a group, each patient was introduced to the basic concepts of Cognitive Behavioral Therapy: that thoughts, feelings, and actions are all connected and influence one another. They were given examples of how overthinking can affect our feelings, actions, and vise versa. The group was then asked to analyze how overthinking was harmful and brainstorm alternative thinking patterns/reactions to the example situation. Then, each group member filled out and identified their own example situation in which a problem situation caused their thoughts, feelings, and actions to be negatively impacted; they were asked to come up with 3 new (more adaptive/positive) thoughts that led to 3 new feelings and actions.  Therapeutic Goals: Patients will review and discuss their past experience with overthinking. Patients will learn the basics of the CBT model through group-led examples.. Patients will identify situations where they may have negative thoughts, feelings, or actions and will then reframe the situation using more positive thoughts to react differently.  Summary of Patient Progress:  Patient contributed to the discussion of how thoughts, feelings, and actions interact, noting when they may have experienced a negative thought pattern and recognized it as harmful. They were attentive when other patients shared their experiences, and worked to reframe their own thoughts in an activity to identify future situations where they may typically overthink.  Therapeutic Modalities:   Cognitive Behavioral Therapy Mindfulness  Aram Beecham,  [place] 02/14/2022  2:15 PM
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
## 2022-02-14 NOTE — Progress Notes (Signed)
Pt denies SI/HI/AVH and verbally agrees to approach staff if these become apparent or before harming themselves/others. Rates depression 0/10. Rates anxiety 5/10. Rates pain 0/10. Pt became very irritable this morning due to hs shoes being taken from him and still not having clarification on his d/c plan. Pt stated that after he talked to the doctors and got clarification, that he was doing a lot better. Writer also spoke to girlfriend whom was also upset and updated her on the plan. Scheduled medications administered to pt, per MD orders. RN provided support and encouragement to pt. Q15 min safety checks implemented and continued. Pt safe on the unit. RN will continue to monitor and intervene as needed.   02/14/22 0740  Psych Admission Type (Psych Patients Only)  Admission Status Voluntary  Psychosocial Assessment  Patient Complaints Anxiety  Eye Contact Fair  Facial Expression Anxious  Affect Anxious;Angry  Speech Logical/coherent  Interaction Assertive  Motor Activity Other (Comment) (WDL)  Appearance/Hygiene Unremarkable  Behavior Characteristics Cooperative;Appropriate to situation;Anxious;Agitated  Mood Anxious;Angry;Pleasant  Thought Process  Coherency WDL  Content WDL  Delusions None reported or observed  Perception WDL  Hallucination None reported or observed  Judgment Impaired  Confusion None  Danger to Self  Current suicidal ideation? Denies  Danger to Others  Danger to Others None reported or observed

## 2022-02-14 NOTE — Progress Notes (Signed)
Adult Psychoeducational Group Note  Date:  02/14/2022 Time:  1:31 PM  Group Topic/Focus:  Goals Group:   The focus of this group is to help patients establish daily goals to achieve during treatment and discuss how the patient can incorporate goal setting into their daily lives to aide in recovery.  Participation Level:  Active  Participation Quality:  Appropriate  Affect:  Appropriate  Cognitive:  Appropriate  Insight: Appropriate  Engagement in Group:  Engaged  Modes of Intervention:  Discussion  Additional Comments:  Patient attended morning orientation group and participated.    Fenix Ruppe W Lorenza Shakir 02/14/2022, 1:31 PM

## 2022-02-14 NOTE — Group Note (Signed)
Recreation Therapy Group Note   Group Topic:Stress Management  Group Date: 02/14/2022 Start Time: 0930 End Time: 0950 Facilitators: Caroll Rancher, [place] Location: [address] Dayroom   Goal Area(s) Addresses:  Patient will identify positive stress management techniques. Patient will identify benefits of using stress management post d/c.  Group Description:  Meditation.  LRT played a meditation that focused on being grounded, grateful and intentional for the day ahead.  Patients were to listen to the meditation and focus on centering themselves and allowing the energy to flow throughout their bodies to become relaxed and open to what the day ahead brings.     Affect/Mood: Appropriate   Participation Level: Engaged   Participation Quality: Independent   Behavior: Appropriate   Speech/Thought Process: Focused   Insight: Good   Judgement: Good   Modes of Intervention: Meditation   Patient Response to Interventions:  Engaged   Education Outcome:  Acknowledges education and In group clarification offered    Clinical Observations/Individualized Feedback: Pt attended and participated in group session.     Plan: Continue to engage patient in RT group sessions 2-3x/week.   Caroll Rancher, LRT,CTRS 02/14/2022 12:02 PM

## 2022-02-14 NOTE — Group Note (Signed)
Occupational Therapy Group Note  Group Topic:Stress Management  Group Date: 02/14/2022 Start Time: 1400 End Time: 1445 Facilitators: Rylin Saez G, OT   Group Description: Group encouraged increased participation and engagement through discussion focused on topic of stress management. Patients engaged interactively to discuss components of stress including physical signs, emotional signs, negative management strategies, and positive management strategies. Each individual identified one new stress management strategy they would like to try moving forward.    Therapeutic Goals: Identify current stressors Identify healthy vs unhealthy stress management strategies/techniques Discuss and identify physical and emotional signs of stress   Participation Level: Active and Engaged   Participation Quality: Independent   Behavior: Appropriate   Speech/Thought Process: Coherent and Directed   Affect/Mood: Appropriate   Insight: Fair   Judgement: Fair   Individualization: pt was active in their participation of group discussion/activity. New skills were identified  Modes of Intervention: Discussion and Education  Patient Response to Interventions:  Attentive   Plan: Continue to engage patient in OT groups 2 - 3x/week.  02/14/2022  Eagan Shifflett G Tekla Malachowski, OT Naydelin Ziegler, OT    

## 2022-02-14 NOTE — Progress Notes (Signed)
   02/14/22 2254  Psych Admission Type (Psych Patients Only)  Admission Status Voluntary  Psychosocial Assessment  Patient Complaints None  Eye Contact Fair  Facial Expression Anxious  Affect Appropriate to circumstance  Speech Logical/coherent  Interaction Assertive  Motor Activity Other (Comment) (WDL)  Appearance/Hygiene Unremarkable  Behavior Characteristics Cooperative  Mood Pleasant  Thought Process  Coherency WDL  Content WDL  Delusions None reported or observed  Perception WDL  Hallucination None reported or observed  Judgment Impaired  Confusion None  Danger to Self  Current suicidal ideation? Denies  Danger to Others  Danger to Others None reported or observed   D: Patient in dayroom reports he had a good day and is able to engage therapeutically. Pt stated he is looking forward to discharge tomorrow. A: Medications administered as prescribed. Support and encouragement provided as needed.  R: Patient remains safe on the unit. Will continue to monitor for safety and stability.

## 2022-02-15 DIAGNOSIS — F332 Major depressive disorder, recurrent severe without psychotic features: Secondary | ICD-10-CM

## 2022-02-15 MED ORDER — ADULT MULTIVITAMIN W/MINERALS CH: 1.0000 | ORAL_TABLET | Freq: Every day | ORAL | Status: AC

## 2022-02-15 MED ORDER — TRAZODONE HCL 50 MG PO TABS: 50.0000 mg | ORAL_TABLET | Freq: Every day | ORAL | 0 refills | Status: AC

## 2022-02-15 MED ORDER — NICOTINE 14 MG/24HR TD PT24
14.0000 mg | MEDICATED_PATCH | Freq: Every day | TRANSDERMAL | 0 refills | Status: AC
Start: 2022-02-16 — End: 2022-03-16

## 2022-02-15 MED ORDER — PROPRANOLOL HCL 40 MG PO TABS: 40.0000 mg | ORAL_TABLET | Freq: Two times a day (BID) | ORAL | 0 refills | Status: AC

## 2022-02-15 MED ORDER — MELATONIN 5 MG PO TABS: 5.0000 mg | ORAL_TABLET | Freq: Every day | ORAL | 0 refills | Status: AC

## 2022-02-15 MED ORDER — SERTRALINE HCL 100 MG PO TABS: 200.0000 mg | ORAL_TABLET | Freq: Every day | ORAL | 0 refills | Status: AC

## 2022-02-15 MED ORDER — FAMOTIDINE 40 MG PO TABS
40.0000 mg | ORAL_TABLET | Freq: Every day | ORAL | 0 refills | Status: AC | PRN
Start: 2022-02-15 — End: 2022-07-20

## 2022-02-15 NOTE — [facility] Suicide Risk Assessment (Signed)
[facility] Discharge Suicide Risk Assessment   Principal Problem: MDD (major depressive disorder), recurrent episode, severe (HCC) Discharge Diagnoses: Principal Problem:   MDD (major depressive disorder), recurrent episode, severe (HCC) Active Problems:   GAD (generalized anxiety disorder)   Alcohol use disorder   Stimulant abuse (HCC)   Total Time spent with patient: 15 minutes  Ricky Ford is a 36 y.o. man with a history of anxiety, PTSD, MDD, and polysubstance use who presented to ED for medical evaluation of withdrawal (after initially presenting to [facility] with concerns for worsening depression, anxiety, desire for detox). He is now admitted to [facility] after inpatient medical stabilization.  During the patient's hospitalization, patient had extensive initial psychiatric evaluation, and follow-up psychiatric evaluations every day.   Psychiatric diagnoses provided upon initial assessment:  #Alcohol Use Disorder #MDD severe recurrent w/ psychosis #GAD   Patient's psychiatric medications were adjusted on admission:  -Re-start home BuSpar 10 mg three times daily (restarted in medical hospital) -Re-start home Trazodone 50 mg each night (restarted in medical hospital) -Re-start home Zoloft 150 mg daily (restarted in medical hospital) -Re-start home Mirtazapine 30 mg daily (restarted in medical hospital) -Re-start Tizanidine 4 mg q8h PRN (restarted in medical hospital) -Re-start home Amlodipine 5 mg daily (restarted in medical hospital) -Stop home Abilify 5 mg daily (restarted in medical hospital) - this is not effective, and could be causing restless legs -Stop home Wellbutrin 150 mg daily (restarted in medical hospital) as pt is in window of alcohol w/d, we will dc due to lowering seizure threshold. Also not effecgive.  -Stop home Ropinirole 0.5 mg each night (restarted in medical hospital) RLS could be caused by abilify, which is being stopped. Monitor.  -Stop home Lamotrigine 100 mg twice  daily (restarted in medical hospital). Pt was not taking. This med was restarted in hospital at full dose. Pt now had redness and skin peeling on face. Denies other skins rashes, mucosal sores. Will start benadryl 25 mg bid x2 doses then add prn doses. Will start hydrocortisone cream. Pt denise h/o seizures. This is dangerous med for pt that is non adherent and who is also at risk of seizure due to alcohol w/d.  -Hold home Rizatriptan 10 mg PRN for migraine (restarted in medical hospital) -Decrease home Propranolol 60 mg twice daily to 40 mg twice daily (restarted in medical hospital) due to bradycardia.    During the hospitalization, other adjustments were made to the patient's psychiatric medication regimen:  -increase zoloft to 200 mg once daily  -we did not restart naltrexone due to high suspicion of pt using opioid product at time of overdose   Patient's care was discussed during the interdisciplinary team meeting every day during the hospitalization.   The patient denied having side effects to prescribed psychiatric medication AT THE TIME OF DISCHARGE, INCLUDING SKIN CHANGES, MUCOSAL SORES, OR RASHES.   Gradually, patient started adjusting to milieu. The patient was evaluated each day by a clinical provider to ascertain response to treatment. Improvement was noted by the patient's report of decreasing symptoms, improved sleep and appetite, affect, medication tolerance, behavior, and participation in unit programming.  Patient was asked each day to complete a self inventory noting mood, mental status, pain, new symptoms, anxiety and concerns.     Symptoms were reported as significantly decreased or resolved completely by discharge.    On day of discharge, the patient reports that their mood is stable. The patient denied having suicidal thoughts for more than 48 hours prior to discharge.  Patient denies having homicidal thoughts.  Patient denies having auditory hallucinations.  Patient denies any  visual hallucinations or other symptoms of psychosis. The patient was motivated to continue taking medication with a goal of continued improvement in mental health.    The patient reports their target psychiatric symptoms of depression, suicidal thoughts, and alcohol w/d symptoms, all responded well to the psychiatric medications, and the patient reports overall benefit other psychiatric hospitalization. Supportive psychotherapy was provided to the patient. The patient also participated in regular group therapy while hospitalized. Coping skills, problem solving as well as relaxation therapies were also part of the unit programming.   Labs were reviewed with the patient, and abnormal results were discussed with the patient.   The patient is able to verbalize their individual safety plan to this provider.   # It is recommended to the patient to continue psychiatric medications as prescribed, after discharge from the hospital.     # It is recommended to the patient to follow up with your outpatient psychiatric provider and PCP.   # It was discussed with the patient, the impact of alcohol, drugs, tobacco have been there overall psychiatric and medical wellbeing, and total abstinence from substance use was recommended the patient.   # Prescriptions provided or sent directly to preferred pharmacy at discharge. Patient agreeable to plan. Given opportunity to ask questions. Appears to feel comfortable with discharge.    # In the event of worsening symptoms, the patient is instructed to call the crisis hotline, 911 and or go to the nearest ED for appropriate evaluation and treatment of symptoms. To follow-up with primary care provider for other medical issues, concerns and or health care needs   # Patient was discharged home with a plan to follow up as noted below.   Psychiatric Specialty Exam  Presentation  General Appearance: Casual; Fairly Groomed  Eye Contact:Good  Speech:Clear and Coherent;  Normal Rate  Speech Volume:Normal  Handedness:Right   Mood and Affect  Mood:Euthymic  Duration of Depression Symptoms: No data recorded Affect:Appropriate; Congruent; Full Range   Thought Process  Thought Processes:Linear  Descriptions of Associations:Intact  Orientation:Full (Time, Place and Person)  Thought Content:Logical  History of Schizophrenia/Schizoaffective disorder:No data recorded Duration of Psychotic Symptoms:No data recorded Hallucinations:Hallucinations: None  Ideas of Reference:None  Suicidal Thoughts:Suicidal Thoughts: No  Homicidal Thoughts:Homicidal Thoughts: No   Sensorium  Memory:Immediate Good; Recent Good; Remote Good  Judgment:Fair  Insight:Fair   Executive Functions  Concentration:Fair  Attention Span:Fair  Recall:Good  Fund of Knowledge:Fair  Language:Good   Psychomotor Activity  Psychomotor Activity:Psychomotor Activity: Normal   Assets  Assets:Communication Skills; Desire for Improvement; Physical Health; Housing; Financial Resources/Insurance; Social Support   Sleep  Sleep:Sleep: Fair   Physical Exam: Physical Exam Vitals reviewed.  Constitutional:      General: He is not in acute distress.    Appearance: He is normal weight. He is not toxic-appearing.  Pulmonary:     Effort: Pulmonary effort is normal. No respiratory distress.  Neurological:     Mental Status: He is alert.     Motor: No weakness.     Gait: Gait normal.  Psychiatric:        Mood and Affect: Mood normal.        Behavior: Behavior normal.        Thought Content: Thought content normal.        Judgment: Judgment normal.    Review of Systems  Constitutional:  Negative for chills and fever.  Cardiovascular:  Negative for chest pain and palpitations.  Neurological:  Negative for dizziness, tingling, tremors and headaches.  Psychiatric/Behavioral:  Positive for substance abuse. Negative for depression, hallucinations, memory loss and  suicidal ideas. The patient is not nervous/anxious and does not have insomnia.    Blood pressure (!) 115/90, pulse 64, temperature (!) 97.5 F (36.4 C), temperature source Oral, resp. rate 16, height 5\' 10"  (1.778 m), weight 90.1 kg, SpO2 100 %. Body mass index is 28.5 kg/m.  Mental Status Per Nursing Assessment::    On Admission:  NA  Demographic factors:  Male, Adolescent or young adult, Caucasian Loss Factors:  Decrease in vocational status, Financial problems / change in socioeconomic status Historical Factors:  Family history of mental illness or substance abuse (Bio father has addiction) Risk Reduction Factors:  Employed, Living with another person, especially a relative, Positive social support, Sense of responsibility to family  Continued Clinical Symptoms:  MDD - mood is stable. Denies SI.  Cognitive Features That Contribute To Risk:  None    Suicide Risk:  Mild:  There are no identifiable suicide plans, no associated intent, mild dysphoria and related symptoms, good self-control (both objective and subjective assessment), few other risk factors, and identifiable protective factors, including available and accessible social support.    Follow-up Information     [facility] Follow up.   Why: You have an appointment for medication management services on Contact information: [address] [place] [phone]         Center, [phone] [facility]. Go on 02/18/2022.   Why: You have an appointment for therapy services on 02/18/22 at 11:00 am.  This appointment will be held in person. Contact information: [address] [place], [place] [phone]                 Plan Of Care/Follow-up recommendations:   Activity: as tolerated   Diet: heart healthy   Other: -Follow-up with your outpatient psychiatric provider -instructions on appointment date, time, and address (location) are  provided to you in discharge paperwork.   -Take your psychiatric medications as prescribed at discharge - instructions are provided to you in the discharge paperwork   -Follow-up with outpatient primary care doctor and other specialists -for management of preventative medicine and chronic medical disease and preventative medicine.    -Testing: Follow-up with outpatient provider for abnormal lab results:  Abnml fasting lipid panel   -Recommend abstinence from alcohol, tobacco, and other illicit drug use at discharge.  Recommend AA, as pt declined other substance treatment options.    -If your psychiatric symptoms recur, worsen, or if you have side effects to your psychiatric medications, call your outpatient psychiatric provider, 911, [phone] or go to the nearest emergency department.   -If suicidal thoughts recur, call your outpatient psychiatric provider, 911, [phone] or go to the nearest emergency department.  [phone], MD 02/15/2022, 9:55 AM

## 2022-02-15 NOTE — [facility] Group Notes (Signed)
Patient attended Pharmacy group .  

## 2022-02-15 NOTE — Progress Notes (Signed)
  [facility] Adult Case Management Discharge Plan :  Will you be returning to the same living situation after discharge:  Yes,  home At discharge, do you have transportation home?: Yes,  father is picking patient up Do you have the ability to pay for your medications: Yes,  insurance  Release of information consent forms completed and in the chart;  Patient's signature needed at discharge.  Patient to Follow up at:  Follow-up Information     [facility]. Go on 03/15/2022.   Why: You have an appointment for medication management services on 03/15/22 at 10:00 am.  This appointment will be held in person. Contact information: [address] [place] [address] [phone]         [facility]. Go on 02/18/2022.   Why: You have an appointment for therapy services on 02/18/22 at 11:00 am.  This appointment will be held in person. Contact information: [address] [place], [place] [address] [phone]                 Next level of care provider has access to [facility] Link:no  Safety Planning and Suicide Prevention discussed: Yes,  girlfriend and mom     Has patient been referred to the Quitline?: Patient refused referral, patient willing to accept brochure and agreed to speak about it with primary care at appt in August  Patient has been referred for addiction treatment: Yes, [facility]  Laelah Siravo E Keni Wafer, LCSW 02/15/2022, 10:26 AM
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
## 2022-02-15 NOTE — Progress Notes (Signed)
Discharge note: SSP complete. RN met with pt and reviewed pt's discharge instructions. Pt verbalized understanding of discharge instructions and pt did not have any questions. RN reviewed and provided pt with a copy of SRA, AVS and Transition Record. RN returned pt's belongings to pt.  Prescriptions were given to pt. Pt denied SI/HI/AVH and voiced no concerns. Pt was appreciative of the care pt received at [facility]. Patient discharged to the lobby without incident.   02/15/22 0757  Psych Admission Type (Psych Patients Only)  Admission Status Voluntary  Psychosocial Assessment  Patient Complaints Anxiety;Depression  Eye Contact Fair  Facial Expression Anxious  Affect Appropriate to circumstance  Speech Logical/coherent  Interaction Assertive  Motor Activity Other (Comment) (WDL)  Appearance/Hygiene Unremarkable  Behavior Characteristics Cooperative;Appropriate to situation;Calm  Mood Anxious;Depressed;Pleasant  Thought Process  Coherency WDL  Content WDL  Delusions None reported or observed  Perception WDL  Hallucination None reported or observed  Judgment Impaired  Confusion None  Danger to Self  Current suicidal ideation? Denies  Danger to Others  Danger to Others None reported or observed
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
## 2022-05-03 ENCOUNTER — Other Ambulatory Visit: Payer: Self-pay | Admitting: Neurology

## 2022-07-19 ENCOUNTER — Emergency Department (HOSPITAL_COMMUNITY)
Admission: EM | Admit: 2022-07-19 | Discharge: 2022-07-21 | Disposition: A | Discharge status: Home / Self Care | Payer: BC Managed Care – PPO | Attending: Emergency Medicine | Admitting: Emergency Medicine

## 2022-07-19 DIAGNOSIS — F1010 Alcohol abuse, uncomplicated: Secondary | ICD-10-CM | POA: Diagnosis not present

## 2022-07-19 DIAGNOSIS — F1090 Alcohol use, unspecified, uncomplicated: Secondary | ICD-10-CM | POA: Diagnosis not present

## 2022-07-19 DIAGNOSIS — R45851 Suicidal ideations: Secondary | ICD-10-CM

## 2022-07-19 NOTE — ED Provider Notes (Signed)
[facility] DEPT Provider Note   CSN: [id number] Arrival date & time: 07/19/22  1939     History {Add pertinent medical, surgical, social history, OB history to HPI:1} Chief Complaint  Patient presents with   Suicidal    Ricky Ford is a 37 y.o. male.  HPI Ricky Ford is a 37 y.o. man with a history of anxiety, PTSD, MDD, and polysubstance.  Patient was last known transported by EMS for alcohol intoxication and suicidal ideation.  Patient reports that he drinks "too much".  Patient reports that he has not over drinks several times a month.  He reports when he drinks he drinks way too much.  Patient reports that he has been having suicidal thoughts.  He does not describe a plan.  He denies other drug use at this time.  He denies any injury.    Home Medications Prior to Admission medications   Medication Sig Start Date End Date Taking? Authorizing Provider  amLODipine (NORVASC) 5 MG tablet Take 5 mg by mouth daily. 07/08/21   [provider]  B Complex Vitamins (VITAMIN-B COMPLEX PO) Take 1 tablet by mouth daily.    [provider]  busPIRone (BUSPAR) 10 MG tablet Take 1 tablet (10 mg total) by mouth 3 (three) times daily. 12/24/20   Dara Hoyer, PA-C  famotidine (PEPCID) 40 MG tablet Take 1 tablet (40 mg total) by mouth daily as needed for heartburn or indigestion. 02/15/22 03/17/22  Massengill, Ovid Curd, MD  lamoTRIgine (LAMICTAL) 100 MG tablet TAKE 1 TABLET BY MOUTH TWICE A DAY 05/05/22   Marcial Pacas, MD  melatonin 5 MG TABS Take 1 tablet (5 mg total) by mouth at bedtime. 02/15/22   Massengill, Ovid Curd, MD  mirtazapine (REMERON) 30 MG tablet TAKE 1 TABLET BY MOUTH AT BEDTIME. 06/23/21   Dara Hoyer, PA-C  Multiple Vitamin (MULTIVITAMIN WITH MINERALS) TABS tablet Take 1 tablet by mouth daily. 02/16/22   Massengill, Ovid Curd, MD  propranolol (INDERAL) 40 MG tablet Take 1 tablet (40 mg total) by mouth 2 (two) times daily. 02/15/22 03/17/22   Massengill, Ovid Curd, MD  rizatriptan (MAXALT-MLT) 10 MG disintegrating tablet Take 1 tablet (10 mg total) by mouth as needed. May repeat in 2 hours if needed 03/11/21   Marcial Pacas, MD  sertraline (ZOLOFT) 100 MG tablet Take 2 tablets (200 mg total) by mouth daily. 02/16/22 03/18/22  Massengill, Ovid Curd, MD  tiZANidine (ZANAFLEX) 4 MG tablet Take 1 tablet (4 mg total) by mouth every 8 (eight) hours as needed. Patient taking differently: Take 4 mg by mouth every 8 (eight) hours as needed (migraines). 08/19/21   Ward Givens, NP  traZODone (DESYREL) 50 MG tablet Take 1 tablet (50 mg total) by mouth at bedtime. 02/15/22 03/17/22  Massengill, Ovid Curd, MD      Allergies    Pollen extract    Review of Systems   Review of Systems  Physical Exam Updated Vital Signs BP 112/77   Pulse (!) 120   Temp 98.3 F (36.8 C) (Oral)   Resp 18   SpO2 93%  Physical Exam Constitutional:      Comments: Patient is alert.  He does smell of alcohol.  Slightly tearful.  No respiratory distress.  Well-nourished well-developed.  HENT:     Head: Normocephalic and atraumatic.     Mouth/Throat:     Pharynx: Oropharynx is clear.  Eyes:     Extraocular Movements: Extraocular movements intact.  Cardiovascular:     Rate and Rhythm: Normal  rate and regular rhythm.  Pulmonary:     Effort: Pulmonary effort is normal.     Breath sounds: Normal breath sounds.  Abdominal:     General: There is no distension.     Palpations: Abdomen is soft.     Tenderness: There is no abdominal tenderness. There is no guarding.  Musculoskeletal:        General: No swelling or tenderness. Normal range of motion.     Cervical back: Neck supple.     Right lower leg: No edema.     Left lower leg: No edema.  Skin:    General: Skin is warm and dry.  Neurological:     General: No focal deficit present.     Mental Status: He is oriented to person, place, and time.     Motor: No weakness.     Coordination: Coordination normal.  Psychiatric:      Comments: Patient appears sad and tearful but has good eye contact.  Mental status is clear.     ED Results / Procedures / Treatments   Labs (all labs ordered are listed, but only abnormal results are displayed) Labs Reviewed  COMPREHENSIVE METABOLIC PANEL  ETHANOL  RAPID URINE DRUG SCREEN, HOSP PERFORMED  CBC WITH DIFFERENTIAL/PLATELET    EKG None  Radiology No results found.  Procedures Procedures  {Document cardiac monitor, telemetry assessment procedure when appropriate:1}  Medications Ordered in ED Medications - No data to display  ED Course/ Medical Decision Making/ A&P                           Medical Decision Making  Patient is acutely intoxicated.  He is alert.  Patient is tearful and endorses suicidal ideation.  He is not specifying the plan.  Patient had left the emergency department but apparently was outside and returned when called on his phone.  At this time with suicidal thoughts and acute intoxication we will proceed with IVC.  Patient has not specify the plan.  Review of EMR indicates prior history of alcohol use and polysubstance. At this time no sign of injury.  Patient appears medically well and does not have comorbid illness except hypertension.  {Document critical care time when appropriate:1} {Document review of labs and clinical decision tools ie heart score, Chads2Vasc2 etc:1}  {Document your independent review of radiology images, and any outside records:1} {Document your discussion with family members, caretakers, and with consultants:1} {Document social determinants of health affecting pt's care:1} {Document your decision making why or why not admission, treatments were needed:1} Final Clinical Impression(s) / ED Diagnoses Final diagnoses:  Suicidal ideation  Alcohol abuse    Rx / DC Orders ED Discharge Orders     None

## 2022-07-19 NOTE — ED Triage Notes (Signed)
Pt BIB [facility] for excessive ETOH and SI. Reports SI for 3 days. Hx ETOH and cocaine abuse.

## 2022-07-19 NOTE — ED Notes (Signed)
Patient answered his phone. States that he was outside. States he is now inside the lobby.

## 2022-07-19 NOTE — ED Notes (Signed)
Pt putting his fingers down his throat. States, "I'm trying to get it out." Nausea meds offered. MD aware.

## 2022-07-19 NOTE — ED Provider Notes (Signed)
20: 00 Patient Eloped before I evaluated. Nursing report police are trying to find patient.    Charlesetta Shanks, MD 07/19/22 2005

## 2022-07-19 NOTE — ED Notes (Signed)
AC made aware that patient has eloped and not been found.

## 2022-07-19 NOTE — ED Notes (Signed)
MD Pfeiffer aware that patient eloped.

## 2022-07-19 NOTE — ED Notes (Signed)
Pt have two belonging bag at nurse desk. PT did have BM on clothes

## 2022-07-19 NOTE — ED Notes (Signed)
Pt back, changed in to purple scrubs.

## 2022-07-19 NOTE — ED Notes (Signed)
Pt not in chair. Security and [employer] made aware. Pt is not IVC'd.

## 2022-07-20 DIAGNOSIS — R45851 Suicidal ideations: Secondary | ICD-10-CM

## 2022-07-20 LAB — CBC WITH DIFFERENTIAL/PLATELET
Abs Immature Granulocytes: 0.05 10*3/uL (ref 0.00–0.07)
Basophils Absolute: 0 10*3/uL (ref 0.0–0.1)
Basophils Relative: 0 %
Eosinophils Absolute: 0 10*3/uL (ref 0.0–0.5)
Eosinophils Relative: 0 %
HCT: 47.1 % (ref 39.0–52.0)
Hemoglobin: 15.8 g/dL (ref 13.0–17.0)
Immature Granulocytes: 0 %
Lymphocytes Relative: 18 %
Lymphs Abs: 2.2 10*3/uL (ref 0.7–4.0)
MCH: 30.7 pg (ref 26.0–34.0)
MCHC: 33.5 g/dL (ref 30.0–36.0)
MCV: 91.5 fL (ref 80.0–100.0)
Monocytes Absolute: 0.7 10*3/uL (ref 0.1–1.0)
Monocytes Relative: 6 %
Neutro Abs: 9.4 10*3/uL — ABNORMAL HIGH (ref 1.7–7.7)
Neutrophils Relative %: 76 %
Platelets: 390 10*3/uL (ref 150–400)
RBC: 5.15 MIL/uL (ref 4.22–5.81)
RDW: 13.5 % (ref 11.5–15.5)
WBC: 12.3 10*3/uL — ABNORMAL HIGH (ref 4.0–10.5)
nRBC: 0 % (ref 0.0–0.2)

## 2022-07-20 LAB — COMPREHENSIVE METABOLIC PANEL
ALT: 24 U/L (ref 0–44)
AST: 23 U/L (ref 15–41)
Albumin: 4.4 g/dL (ref 3.5–5.0)
Alkaline Phosphatase: 81 U/L (ref 38–126)
Anion gap: 19 — ABNORMAL HIGH (ref 5–15)
BUN: 17 mg/dL (ref 6–20)
CO2: 18 mmol/L — ABNORMAL LOW (ref 22–32)
Calcium: 8.8 mg/dL — ABNORMAL LOW (ref 8.9–10.3)
Chloride: 100 mmol/L (ref 98–111)
Creatinine, Ser: 0.75 mg/dL (ref 0.61–1.24)
GFR, Estimated: >60 mL/min (ref >60)
Glucose, Bld: 114 mg/dL — ABNORMAL HIGH (ref 70–99)
Potassium: 4.1 mmol/L (ref 3.5–5.1)
Sodium: 137 mmol/L (ref 135–145)
Total Bilirubin: 0.5 mg/dL (ref 0.3–1.2)
Total Protein: 8.9 g/dL — ABNORMAL HIGH (ref 6.5–8.1)

## 2022-07-20 LAB — ETHANOL: Alcohol, Ethyl (B): 269 mg/dL — ABNORMAL HIGH (ref <10)

## 2022-07-20 MED ORDER — LORAZEPAM 1 MG PO TABS
1.0000 mg | ORAL_TABLET | ORAL | Status: DC | PRN
  Administered 2022-07-20 (×3): 2 mg via ORAL
  Administered 2022-07-21: 1 mg via ORAL
  Filled 2022-07-20 (×2): qty 2
  Filled 2022-07-20: qty 1
  Filled 2022-07-20: qty 2

## 2022-07-20 MED ORDER — DIPHENHYDRAMINE HCL 25 MG PO CAPS
25.0000 mg | ORAL_CAPSULE | Freq: Once | ORAL | Status: AC
  Administered 2022-07-20: 25 mg via ORAL
  Filled 2022-07-20: qty 1

## 2022-07-20 MED ORDER — METOCLOPRAMIDE HCL 10 MG PO TABS
5.0000 mg | ORAL_TABLET | Freq: Once | ORAL | Status: AC
  Administered 2022-07-20: 5 mg via ORAL
  Filled 2022-07-20: qty 1

## 2022-07-20 MED ORDER — THIAMINE HCL 100 MG/ML IJ SOLN
100.0000 mg | Freq: Every day | INTRAMUSCULAR | Status: DC
  Filled 2022-07-20: qty 2

## 2022-07-20 MED ORDER — TRAZODONE HCL 100 MG PO TABS: 50.0000 mg | ORAL_TABLET | Freq: Once | ORAL | Status: DC | PRN

## 2022-07-20 MED ORDER — ONDANSETRON HCL 4 MG PO TABS
4.0000 mg | ORAL_TABLET | Freq: Once | ORAL | Status: AC
  Administered 2022-07-20: 4 mg via ORAL
  Filled 2022-07-20: qty 1

## 2022-07-20 MED ORDER — ADULT MULTIVITAMIN W/MINERALS CH
1.0000 | ORAL_TABLET | Freq: Every day | ORAL | Status: DC
  Administered 2022-07-20 – 2022-07-21 (×2): 1 via ORAL
  Filled 2022-07-20 (×2): qty 1

## 2022-07-20 MED ORDER — FOLIC ACID 1 MG PO TABS
1.0000 mg | ORAL_TABLET | Freq: Every day | ORAL | Status: DC
  Administered 2022-07-20 – 2022-07-21 (×2): 1 mg via ORAL
  Filled 2022-07-20 (×2): qty 1

## 2022-07-20 MED ORDER — THIAMINE MONONITRATE 100 MG PO TABS
100.0000 mg | ORAL_TABLET | Freq: Every day | ORAL | Status: DC
  Administered 2022-07-20 – 2022-07-21 (×2): 100 mg via ORAL
  Filled 2022-07-20 (×2): qty 1

## 2022-07-20 NOTE — ED Notes (Signed)
Patient has finally been able to get some rest and some relief of his symptoms from withdrawal   his girl friend has visited twice today   he has had all of his meals and plenty of fluids all day  he has been very pleasant and gets along well with the other patients
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
## 2022-07-20 NOTE — ED Notes (Signed)
Patient is starting to calm down and not be as restless as he has been during the day   he has been very cooperative and pleasant

## 2022-07-20 NOTE — Progress Notes (Signed)
TOC consulted to provide SA resources. Resources provided and attached to AVS. TTS has been consulted to see the pt. TOC will reengage if/when cleared TTS' care. TOC signing off.

## 2022-07-20 NOTE — ED Notes (Signed)
Patient has been given ativan to help with his withdrawals   CIWA has also been put in place one hour after medications

## 2022-07-21 DIAGNOSIS — F1090 Alcohol use, unspecified, uncomplicated: Secondary | ICD-10-CM

## 2022-07-21 NOTE — ED Provider Notes (Signed)
Patient evaluated by psychiatry and is cleared for discharge. He was given outpatient mental health and substance use resources.    Leanord Asal K, DO 07/21/22 1607

## 2022-07-21 NOTE — ED Provider Notes (Signed)
Emergency Medicine Observation Re-evaluation Note  Ricky Ford is a 37 y.o. male, seen on rounds today.  Pt initially presented to the ED for complaints of Suicidal Currently, the patient is resting comfortably.  Physical Exam  BP (!) 140/102   Pulse (!) 105   Temp 98.7 F (37.1 C) (Oral)   Resp 20   SpO2 92%  Physical Exam General: Resting comfortably Cardiac: Well-perfused Lungs: Breathing comfortably Psych: Nonagitated  ED Course / MDM  EKG:   I have reviewed the labs performed to date as well as medications administered while in observation.  Recent changes in the last 24 hours include TTS evaluation yesterday, CIWA protocol.  Plan  Current plan is for TTS reevaluation today/CIWA protocol.    Elgie Congo, MD 07/21/22 [phone]
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
## 2022-07-21 NOTE — Discharge Instructions (Addendum)
Discharge recommendations:  Patient is to take medications as prescribed. Please see information for follow-up appointment with psychiatry and therapy. Please follow up with your primary care provider for all medical related needs.  Please follow up with a AA services.  Therapy: We recommend that patient participate in individual therapy to address mental health concerns.  Medications: The patient or guardian is to contact a medical professional and/or outpatient provider to address any new side effects that develop. The patient or guardian should update outpatient providers of any new medications and/or medication changes.   Atypical antipsychotics: If you are prescribed an atypical antipsychotic, it is recommended that your height, weight, BMI, blood pressure, fasting lipid panel, and fasting blood sugar be monitored by your outpatient providers.  Safety:  The patient should abstain from use of illicit substances/drugs and abuse of any medications. If symptoms worsen or do not continue to improve or if the patient becomes actively suicidal or homicidal then it is recommended that the patient return to the closest hospital emergency department, the [facility], or call 911 for further evaluation and treatment. National Suicide Prevention Lifeline 1-800-SUICIDE or 814-194-2831.  About 988 988 offers 24/7 access to trained crisis counselors who can help people experiencing mental health-related distress. People can call or text 988 or chat 988lifeline.org for themselves or if they are worried about a loved one who may need crisis support.  Crisis Mobile: [facility]:                     (712)301-6862 (for crisis response 24 hours a day) [place]:                                            [phone]

## 2022-08-30 ENCOUNTER — Telehealth: Payer: BC Managed Care – PPO | Admitting: Adult Health

## 2023-03-03 ENCOUNTER — Other Ambulatory Visit: Payer: BC Managed Care – PPO | Admitting: Hematology and Oncology

## 2023-03-03 DIAGNOSIS — L989 Disorder of the skin and subcutaneous tissue, unspecified: Secondary | ICD-10-CM

## 2023-03-03 DIAGNOSIS — Z1283 Encounter for screening for malignant neoplasm of skin: Secondary | ICD-10-CM

## 2023-03-03 NOTE — Progress Notes (Signed)
Type of Exam: Above Waist    Exam comments: Upper back with lipoma that has been present for years and is increasing in size. Lower back with skin tag.  Presumptive diagnosis: seborrheic keratosis and other (see exam comments) Biopsy recommended? No Referred? Yes

## 2023-09-05 IMAGING — DX DG CHEST 2V
2 series · 2 of 2 positions shown · non-contrast
Comparison: None

CLINICAL DATA: Positive PPD.  Chronic dry cough.

EXAM:
CHEST - 2 VIEW

[dg chest 2 view (1 of 2)]
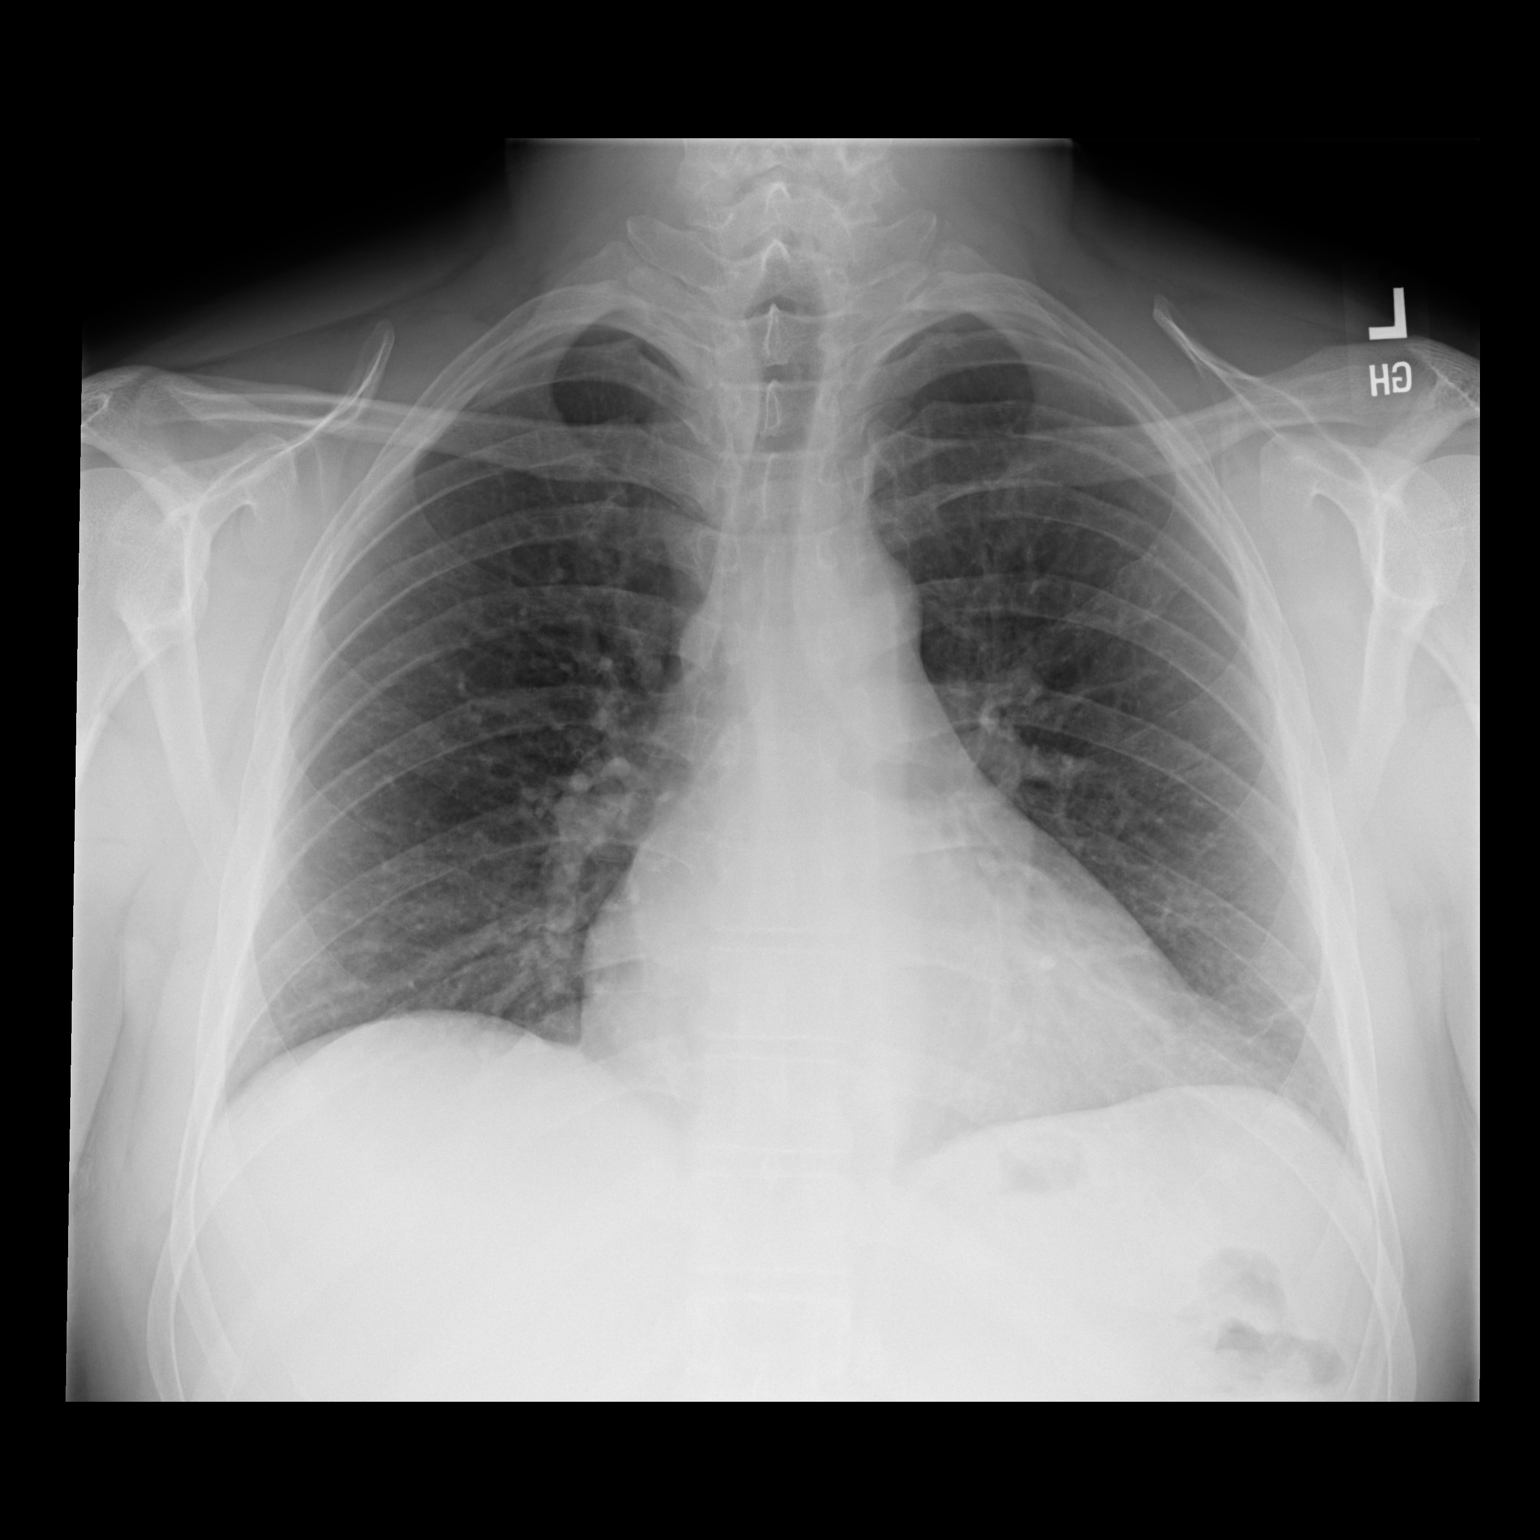

[dg chest 2 view (2 of 2)]
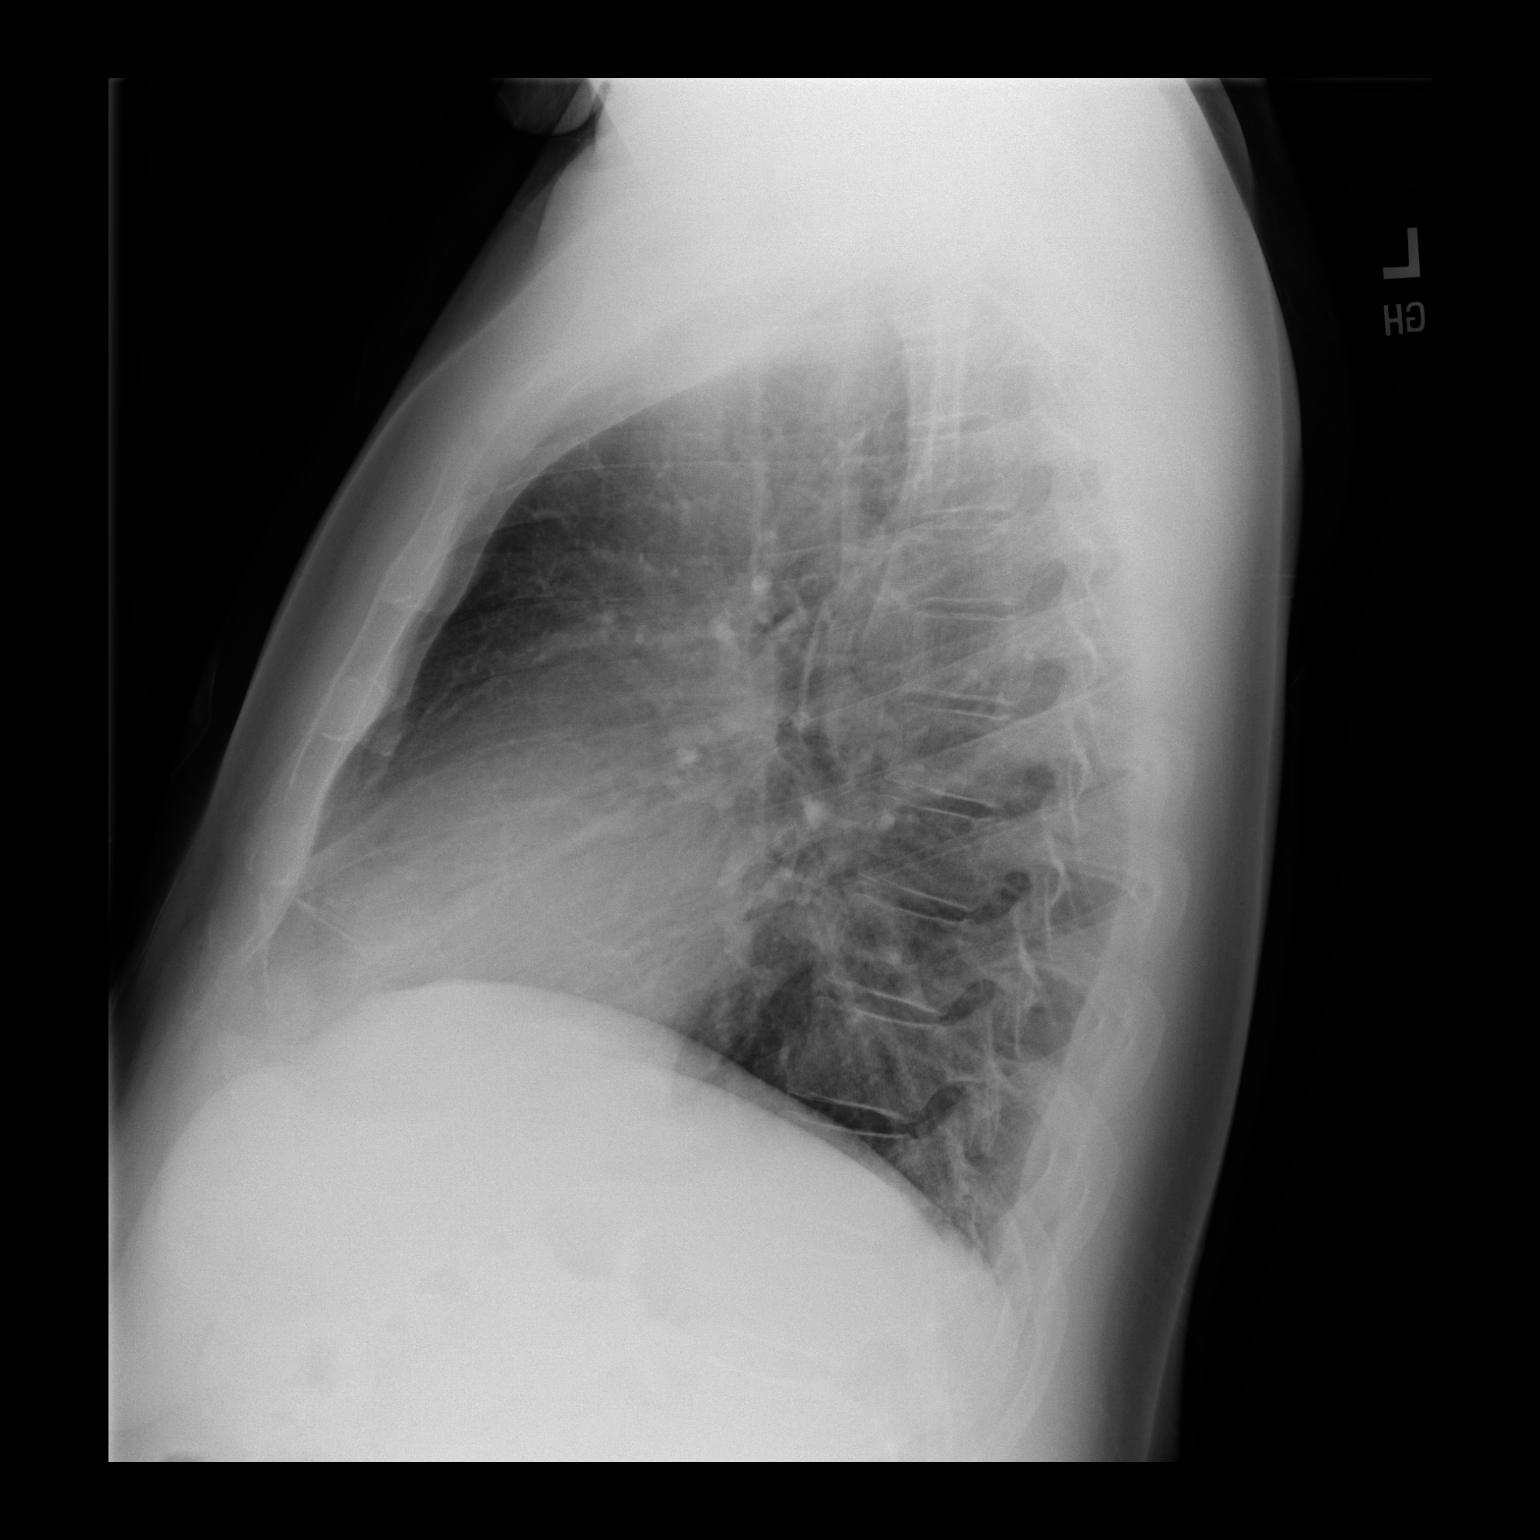

[2 of 2 positions shown; findings below may reference images not displayed]

FINDINGS: The cardiac silhouette is borderline. The hila, mediastinum, lungs,
and pleura are otherwise unremarkable. Mild atelectasis or scar in
the lingula.
IMPRESSION: No active cardiopulmonary disease.
